# Patient Record
Sex: Male | Born: 1980
Health system: Southern US, Community
[De-identification: ages and names within clinical notes are randomized; demographics above are authoritative.]

## PROBLEM LIST (undated history)

## (undated) DIAGNOSIS — F32A Depression, unspecified: Secondary | ICD-10-CM

## (undated) DIAGNOSIS — F419 Anxiety disorder, unspecified: Secondary | ICD-10-CM

## (undated) DIAGNOSIS — F329 Major depressive disorder, single episode, unspecified: Secondary | ICD-10-CM

## (undated) HISTORY — PX: WISDOM TOOTH EXTRACTION: SHX21

## (undated) HISTORY — DX: Major depressive disorder, single episode, unspecified: F32.9

## (undated) HISTORY — DX: Depression, unspecified: F32.A

## (undated) HISTORY — DX: Anxiety disorder, unspecified: F41.9

---

## 2010-02-20 ENCOUNTER — Ambulatory Visit
Admission: RE | Admit: 2010-02-20 | Discharge: 2010-02-20 | Payer: Self-pay | Source: Home / Self Care | Attending: Internal Medicine | Admitting: Internal Medicine

## 2010-02-20 DIAGNOSIS — Z720 Tobacco use: Secondary | ICD-10-CM | POA: Insufficient documentation

## 2010-02-20 DIAGNOSIS — F329 Major depressive disorder, single episode, unspecified: Secondary | ICD-10-CM | POA: Insufficient documentation

## 2010-02-20 DIAGNOSIS — Z789 Other specified health status: Secondary | ICD-10-CM

## 2010-02-20 DIAGNOSIS — J45909 Unspecified asthma, uncomplicated: Secondary | ICD-10-CM | POA: Insufficient documentation

## 2010-02-20 DIAGNOSIS — F418 Other specified anxiety disorders: Secondary | ICD-10-CM | POA: Insufficient documentation

## 2010-02-24 ENCOUNTER — Telehealth: Payer: Self-pay | Admitting: Internal Medicine

## 2010-03-06 NOTE — Progress Notes (Signed)
Summary: Refill Request  Phone Note Refill Request Call back at [phone] Message from:  Pharmacy on February 24, 2010 8:48 AM  Refills Requested: Medication #1:  KLONOPIN 1 MG TABS 1 by mouth once daily   Dosage confirmed as above?Dosage Confirmed   Brand Name Necessary? No   Supply Requested: 31   [facility]: [facility] [address]  Next Appointment Scheduled: none Initial call taken by: Harold Barban,  February 24, 2010 8:48 AM  Follow-up for Phone Call        Hopp please advise on number to be dispensed to patient #30 or #90 with no refills  ** Never written by our office** Follow-up by: Shonna Chock CMA,  February 24, 2010 2:03 PM  Additional Follow-up for Phone Call Additional follow up Details #1::        prescription faxed. Additional Follow-up by: Lucious Groves CMA,  February 24, 2010 4:42 PM    Prescriptions: KLONOPIN 1 MG TABS (CLONAZEPAM) 1 by mouth once daily  #30 x 5   Entered and Authorized by:   Marga Melnick MD   Signed by:   Marga Melnick MD on 02/24/2010   Method used:   Printed then faxed to ...       [facility]  [address]  [phone]* (retail)       [address] [place], [place]  [address]       Ph: [phone] or [phone]       Fax: [phone]   RxID:   [id number]

## 2010-03-06 NOTE — Assessment & Plan Note (Signed)
Summary: new to est//lch   Vital Signs:  Patient profile:   30 year old male Height:      74.25 inches Weight:      248.2 pounds BMI:     31.77 Temp:     97.5 degrees F oral Pulse rate:   76 / minute Resp:     14 per minute BP sitting:   124 / 84  (left arm) Cuff size:   large  Vitals Entered By: Shonna Chock CMA (February 20, 2010 11:29 AM) CC: 1.) New patient established: Discuss/Follow meds  2.)Examine mole on back-increased in size   CC:  1.) New patient established: Discuss/Follow meds  2.)Examine mole on back-increased in size.  History of Present Illness:    Stephen Compton is here to establish as a new patient ; he is asymptomatic. His wife is concerned about his smokeless tobacco use.  Preventive Screening-Counseling & Management  Alcohol-Tobacco     Smoking Status: quit  Caffeine-Diet-Exercise     Does Patient Exercise: yes  Current Medications (verified): 1)  Klonopin 1 Mg Tabs (Clonazepam) .Marland Kitchen.. 1 By Mouth Once Daily 2)  Prozac 30mg  .... 1 By Mouth Once Daily  Allergies (verified): No Known Drug Allergies  Past History:  Past Medical History: Anxiety/ Depression  Asthma, childhood , PMH of , now quiescent  Past Surgical History: Rotator cuff repair & spur spur resection  2006  Family History: Father: living, estranged Mother: living, hypothyroidism Siblings: 1  brother: HTN  Social History: Occupation: Teacher, English as a foreign language Married Former Smoker: quit age 35; Smokeless tobacco > 1 can / day Alcohol use-yes: 1X /week Regular exercise-yes: 3-4 X / week Smoking Status:  quit Does Patient Exercise:  yes  Review of Systems  The patient denies anorexia, fever, weight loss, weight gain, vision loss, decreased hearing, hoarseness, chest pain, syncope, dyspnea on exertion, peripheral edema, prolonged cough, headaches, hemoptysis, abdominal pain, melena, hematochezia, severe indigestion/heartburn, hematuria, suspicious skin lesions, unusual weight change, abnormal  bleeding, enlarged lymph nodes, and angioedema.   Psych:  Complains of easily angered and irritability; denies anxiety, depression, easily tearful, and panic attacks; Fairly stable on meds.  Physical Exam  General:  Well-developed,well-nourished; alert,appropriate and cooperative throughout examination Head:  Normocephalic and atraumatic without obvious abnormalities. No  alopecia . Moustache & beard Eyes:  No corneal or conjunctival inflammation noted. Perrla. Funduscopic exam benign, without hemorrhages, exudates or papilledema.  Ears:  External ear exam shows no significant lesions or deformities.  Otoscopic examination reveals clear canals, tympanic membranes are intact bilaterally without bulging, retraction, inflammation or discharge. Hearing is grossly normal bilaterally. Nose:  External nasal examination shows no deformity or inflammation. Nasal mucosa are pink and moist without lesions or exudates. Septal dislocation Mouth:  Oral mucosa and oropharynx without lesions or exudates.  Teeth in  fair  repair. Tiny L posterior oral mucosal white lesion Neck:  No deformities, masses, or tenderness noted. Lungs:  Normal respiratory effort, chest expands symmetrically. Lungs are clear to auscultation, no crackles or wheezes. Heart:  Normal rate and regular rhythm. S1 and S2 normal without gallop, murmur, click, rub . Abdomen:  Bowel sounds positive,abdomen soft and non-tender without masses, organomegaly or hernias noted. Rectal:  deferred Genitalia:  Testes bilaterally descended without nodularity, tenderness or masses. No scrotal masses or lesions. No penis lesions or urethral discharge. L varicocele.   Prostate:  Deferred Msk:  No deformity or scoliosis noted of thoracic or lumbar spine.   Pulses:  R and L carotid,radial,dorsalis pedis and  posterior tibial pulses are full and equal bilaterally Extremities:  No clubbing, cyanosis, edema, or deformity noted with normal full range of motion of  all joints.   Neurologic:  alert & oriented X3 and DTRs symmetrical and normal.   Skin:  scattered tatooes Cervical Nodes:  No lymphadenopathy noted Axillary Nodes:  No palpable lymphadenopathy Inguinal Nodes:  No significant adenopathy Psych:  memory intact for recent and remote, normally interactive, and good eye contact.     Impression & Recommendations:  Problem # 1:  ROUTINE GENERAL MEDICAL EXAM@HEALTH  CARE FACL (ICD-V70.0)  Problem # 2:  NICOTINE ADDICTION (ICD-305.1) Smokeless tobacco use; [facility] Dopamine  driven  pathophysiology discussed  Problem # 3:  ANXIETY (ICD-300.00) Pathophysiology of Neurotransmitter Deficiency discussed. Therapeutic options  reviewed His updated medication list for this problem includes:    Klonopin 1 Mg Tabs (Clonazepam) .Marland Kitchen... 1 by mouth once daily    Paroxetine Hcl 30 Mg Tabs (Paroxetine hcl)  Complete Medication List: 1)  Klonopin 1 Mg Tabs (Clonazepam) .Marland Kitchen.. 1 by mouth once daily 2)  Paroxetine Hcl 30 Mg Tabs (Paroxetine hcl)  Patient Instructions: 1)   Monthly self exams.See Dentist every 6 months.Consider fasting labs: 2)  BMP ; 3)  Hepatic Panel ; 4)  Lipid Panel ; 5)  TSH ; 6)  CBC w/ Diff . 7)  Stopping tobacco tips: MAKE  A COMMITTMENT.Choose a Quit date. Cut down before the Quit date. decide what you will do as a substitute when you feel the urge to  use smokeless tobacco(artificial substitute,gum,toothpick,exercise).   Orders Added: 1)  New Patient 18-39 years [[id number]]

## 2010-08-11 ENCOUNTER — Other Ambulatory Visit: Payer: Self-pay | Admitting: Internal Medicine

## 2010-08-11 MED ORDER — CLONAZEPAM 1 MG PO TABS: 1.0000 mg | ORAL_TABLET | Freq: Every day | ORAL | Status: DC

## 2010-08-11 NOTE — Telephone Encounter (Signed)
RX sent to pharmacy  

## 2010-09-17 ENCOUNTER — Other Ambulatory Visit: Payer: Self-pay | Admitting: Internal Medicine

## 2010-10-02 ENCOUNTER — Telehealth: Payer: Self-pay | Admitting: Internal Medicine

## 2010-10-02 ENCOUNTER — Encounter: Payer: Self-pay | Admitting: Internal Medicine

## 2010-10-02 NOTE — Telephone Encounter (Signed)
Patient's only visit was 02/13/2010---tried to call--phone not in service---mailed a letter requesting call to [place] to set up appt for followup appointment and labs

## 2010-10-02 NOTE — Telephone Encounter (Signed)
Message copied by Merrie Roof on Thu Oct 02, 2010  5:05 PM ------      Message from: Pecola Lawless      Created: Sun Sep 21, 2010  9:43 AM       Please verify last office visit; & verify F/U. No records in EMR of labs or OV

## 2010-10-03 ENCOUNTER — Other Ambulatory Visit: Payer: Self-pay | Admitting: Internal Medicine

## 2010-10-03 NOTE — Telephone Encounter (Signed)
Left detailed msg on pharmacy voicemail

## 2010-10-03 NOTE — Telephone Encounter (Signed)
Denied; there is no record of any followup for well over a year. Office visit would be necessary before refill.

## 2010-10-03 NOTE — Telephone Encounter (Signed)
Last filled 08-11-10 #30 3 last OV 02-20-10

## 2010-11-25 ENCOUNTER — Encounter: Payer: Self-pay | Admitting: Internal Medicine

## 2010-11-27 ENCOUNTER — Ambulatory Visit (INDEPENDENT_AMBULATORY_CARE_PROVIDER_SITE_OTHER): Payer: 59 | Admitting: Internal Medicine

## 2010-11-27 ENCOUNTER — Encounter: Payer: Self-pay | Admitting: Internal Medicine

## 2010-11-27 DIAGNOSIS — F411 Generalized anxiety disorder: Secondary | ICD-10-CM

## 2010-11-27 DIAGNOSIS — M25819 Other specified joint disorders, unspecified shoulder: Secondary | ICD-10-CM

## 2010-11-27 DIAGNOSIS — M7542 Impingement syndrome of left shoulder: Secondary | ICD-10-CM

## 2010-11-27 MED ORDER — CLONAZEPAM 1 MG PO TABS: 1.0000 mg | ORAL_TABLET | Freq: Every day | ORAL | Status: DC

## 2010-11-27 NOTE — Patient Instructions (Signed)
Consider glucosamine sulfate 1500 mg daily for joint symptoms. Take this daily  for 3 months and then leave it off for 2 months. This will rehydrate the cartilages.  

## 2010-11-27 NOTE — Progress Notes (Signed)
  Subjective:    Patient ID: Stephen Compton, male    DOB: 06-Oct-1980, 30 y.o.   MRN: [id number]  HPI #1Extremity pain Location:L mid clavicle Onset:12 mos ago Trigger/injury:no Pain quality: variable , dull to sharp Pain severity:up to 8 Duration:hours, up to all day, especially post weight gym work Radiation:no Exacerbating factors:any weight Treatment/response:rest; NSAIDS & Tylenol , ? benefit Review of systems: Constitutional: no fever, chills, sweats, change in weight  Musculoskeletal:no  muscle cramps or pain; no shoulder  joint stiffness, redness, or swelling Skin:no rash, color change Neuro: no weakness; numbness and tingling Heme:no lymphadenopathy; abnormal bruising or bleeding   He had rotator cuff surgery on the right.  #2 he has been on Klonopin "forever". He does take this once a day and has been effective in relationship to anxiety/stress. He never takes more than one half a day. This is usually if he is in situations such taking an exam. This was originally prescribed at urgent care. He's had difficulty getting this refilled despite the fact that he has also not been over utilizing it.      Review of Systems he has noted pain in the  left knee particularly with squatting.     Objective:   Physical Exam he is healthy and well muscled and toned; no acute distress  Range of motion of the neck, shoulder and lower extremities is normal.  He has point tenderness over the distal left clavicle. He has pain with elevation of the left upper extremity  He has mild crepitus in the left knee. No effusion is apparent  Strength and tone are normal as are deep tendon reflexes.  Affect is appropriate; he is oriented         Assessment & Plan:  #1 left shoulder impingement syndrome  #2 additional knee pain without significant finding  Plan: See orders and recommendations.

## 2010-11-28 ENCOUNTER — Other Ambulatory Visit: Payer: Self-pay | Admitting: Internal Medicine

## 2010-11-28 ENCOUNTER — Telehealth: Payer: Self-pay | Admitting: Internal Medicine

## 2010-11-28 DIAGNOSIS — F411 Generalized anxiety disorder: Secondary | ICD-10-CM

## 2010-11-28 MED ORDER — CLONAZEPAM 1 MG PO TABS: 1.0000 mg | ORAL_TABLET | Freq: Every day | ORAL | Status: DC | PRN

## 2010-11-28 NOTE — Telephone Encounter (Signed)
Spoke with patient, he takes one every day and some days needs one and one half. Patient requesting RX to read 1 TO 1 and 1/2 tab once daily PRN. OK?

## 2010-11-28 NOTE — Telephone Encounter (Signed)
OK 

## 2010-11-28 NOTE — Telephone Encounter (Signed)
I called the pharmacy and spoke with pharmacist, dispensed #90/1 refill

## 2010-11-28 NOTE — Telephone Encounter (Signed)
Please state one daily as needed

## 2010-11-28 NOTE — Telephone Encounter (Signed)
Patient had appt [phone] to discuss klonopin - rx was sent  1 tablet daily - patient wants instruction to state prn - [facility]

## 2010-11-28 NOTE — Telephone Encounter (Signed)
Patient and pharm informed.

## 2011-03-13 ENCOUNTER — Other Ambulatory Visit: Payer: Self-pay | Admitting: Internal Medicine

## 2011-05-18 ENCOUNTER — Other Ambulatory Visit: Payer: Self-pay | Admitting: Internal Medicine

## 2011-05-18 DIAGNOSIS — F411 Generalized anxiety disorder: Secondary | ICD-10-CM

## 2011-05-18 NOTE — Telephone Encounter (Signed)
Refill Clonazepam 1MG  Tablet Take 1 to 1%1/2 tablets by mouth every day as needed  No qty listed   Last written 10.26.12 qty 90  Last OV 10.25.12 Current visit on 4.18 for Medication f/u

## 2011-05-19 MED ORDER — CLONAZEPAM 1 MG PO TABS: 1.0000 mg | ORAL_TABLET | Freq: Every day | ORAL | Status: DC | PRN

## 2011-05-19 NOTE — Telephone Encounter (Signed)
RX sent

## 2011-05-21 ENCOUNTER — Ambulatory Visit (INDEPENDENT_AMBULATORY_CARE_PROVIDER_SITE_OTHER): Payer: 59 | Admitting: Internal Medicine

## 2011-05-21 ENCOUNTER — Encounter: Payer: Self-pay | Admitting: Internal Medicine

## 2011-05-21 VITALS — BP 118/78 | HR 66 | Temp 98.3°F | Wt 240.0 lb

## 2011-05-21 DIAGNOSIS — M546 Pain in thoracic spine: Secondary | ICD-10-CM

## 2011-05-21 MED ORDER — CYCLOBENZAPRINE HCL 5 MG PO TABS: ORAL_TABLET | ORAL | Status: DC

## 2011-05-21 MED ORDER — TRAMADOL HCL 50 MG PO TABS: 50.0000 mg | ORAL_TABLET | Freq: Four times a day (QID) | ORAL | Status: AC | PRN

## 2011-05-21 NOTE — Progress Notes (Signed)
  Subjective:    Patient ID: Stephen Compton, male    DOB: 08/08/1980, 31 y.o.   MRN: [id number]  HPI  BACK PAIN Location: left upper back   Quality: Sharp Onset: 4 days ago Worse with: movement, pain wakes pt up at night  Better with: Pt does not notice at work because he is so busy Radiation: no Trauma: 4 days ago, pt remembers reaching out to help son (19 months) from slipping in bath tub. Pain started several hrs later Best sitting/standing/leaning forward: pain feels the same regardless of position Red Flags Fecal/urinary incontinence: pt denies Numbness/Weakness: pt denies Fever/chills/sweats: pt denies Unexplained weight loss: pt denies No relief with bedrest: somewhat h/o cancer/immunosuppression: pt denies PMH of osteoporosis or chronic steroid use: pt denies  Review of Systems he denies pleurisy, cough, sputum production or hemoptysis. He also has not had abdominal pain, nausea or vomiting.  Pt also denies rash. Pt denies shingles. Pt never had chicken pox as a child and does not know if he received the vaccine. Pt has mild arthritis in left knee - mobic X 1 month - did not help so pt stopped taking medication. Pt taking joint vitamins from [facility] shop. Pt says pain is worse with running. Has been referred to ortho specialist. Pt has also had right shoulder surgery.     Objective:   Physical Exam He's healthy well-nourished and in no distress  He has no lymphadenopathy about the neck or axilla Chest is clear with no rubs, rhonchi, or increased work of breathing  His is soft S4 with no murmurs or gallops  Abdomen is nontender with no organomegaly or masses  His no edema, clubbing, or cyanosis. Homans sign is negative.  Skin is clear with no rash; there is a single papule in the mid upper spine area without vesicle formation He does have some point tenderness to deep palpation in the left upper chest        Assessment & Plan:  #1 chest wall pain probably from  hyperextension and lifting during the incident noted above.  Plan: See orders and recommendations. He was asked to monitor for any rash in this area and to call immediately

## 2011-05-21 NOTE — Patient Instructions (Signed)
Take the tramadol at bedtime as needed; do not take it within 8-12 hours the sertraline. Report rash @ site of pain

## 2011-08-17 ENCOUNTER — Other Ambulatory Visit: Payer: Self-pay | Admitting: Internal Medicine

## 2011-08-17 DIAGNOSIS — F411 Generalized anxiety disorder: Secondary | ICD-10-CM

## 2011-08-17 MED ORDER — CLONAZEPAM 1 MG PO TABS: 1.0000 mg | ORAL_TABLET | Freq: Every day | ORAL | Status: DC | PRN

## 2011-08-17 NOTE — Telephone Encounter (Signed)
refill ClonazePAM (Tab) KLONOPIN 1 MG Take 1-1.5 tablets (1-1.5 mg total) by mouth daily as needed for anxiety. #90, LAST FILL 5.31.13 Last ov 4.18.13

## 2011-08-17 NOTE — Telephone Encounter (Signed)
RX called in .

## 2011-11-13 ENCOUNTER — Other Ambulatory Visit: Payer: Self-pay | Admitting: Internal Medicine

## 2011-11-13 NOTE — Telephone Encounter (Signed)
Refill done.  

## 2011-12-06 ENCOUNTER — Other Ambulatory Visit: Payer: Self-pay | Admitting: Internal Medicine

## 2012-02-05 ENCOUNTER — Other Ambulatory Visit: Payer: Self-pay | Admitting: Internal Medicine

## 2012-02-05 NOTE — Telephone Encounter (Signed)
Left message on voicemail informing patient to return call when available. Reason for call: inform patient Controlled Substance Contract to be signed @ office and then rx to be given

## 2012-02-05 NOTE — Telephone Encounter (Signed)
RX and controlled substance contract placed at the front

## 2012-02-05 NOTE — Telephone Encounter (Signed)
Pt returned your call. I advised him he will need to come to office and sign contract. Pt understood.

## 2012-03-03 ENCOUNTER — Encounter: Payer: Self-pay | Admitting: Internal Medicine

## 2012-04-28 ENCOUNTER — Encounter: Payer: Self-pay | Admitting: Internal Medicine

## 2012-04-28 ENCOUNTER — Ambulatory Visit (INDEPENDENT_AMBULATORY_CARE_PROVIDER_SITE_OTHER): Payer: BC Managed Care – PPO | Admitting: Internal Medicine

## 2012-04-28 VITALS — BP 130/78 | HR 84 | Temp 98.5°F | Resp 12 | Ht 74.03 in | Wt 250.0 lb

## 2012-04-28 DIAGNOSIS — F418 Other specified anxiety disorders: Secondary | ICD-10-CM

## 2012-04-28 DIAGNOSIS — K1370 Unspecified lesions of oral mucosa: Secondary | ICD-10-CM

## 2012-04-28 DIAGNOSIS — F341 Dysthymic disorder: Secondary | ICD-10-CM

## 2012-04-28 DIAGNOSIS — Z0000 Encounter for general adult medical examination without abnormal findings: Secondary | ICD-10-CM

## 2012-04-28 DIAGNOSIS — Z720 Tobacco use: Secondary | ICD-10-CM

## 2012-04-28 DIAGNOSIS — F17200 Nicotine dependence, unspecified, uncomplicated: Secondary | ICD-10-CM

## 2012-04-28 MED ORDER — DULOXETINE HCL 30 MG PO CPEP: ORAL_CAPSULE | ORAL | Status: DC

## 2012-04-28 MED ORDER — CLONAZEPAM 1 MG PO TABS: ORAL_TABLET | ORAL | Status: DC

## 2012-04-28 NOTE — Patient Instructions (Addendum)
Please  schedule fasting Labs : BMET,Lipids, hepatic panel, CBC & dif, TSH.PLEASE BRING THESE INSTRUCTIONS TO FOLLOW UP  LAB APPOINTMENT.This will guarantee correct labs are drawn, eliminating need for repeat blood sampling ( needle sticks ! ). Diagnoses /Codes: V70.0.  If you activate the  My Chart system; lab & Xray results will be released directly  to you as soon as I review & address these through the computer. If you choose not to sign up for My Chart within 36 hours of labs being drawn; results will be reviewed & interpretation added before being copied & mailed, causing a delay in getting the results to you.If you do not receive that report within 7-10 days ,please call. Additionally you can use this system to gain direct  access to your records  if  out of town or @ an office of a  physician who is not in  the My Chart network.  This improves continuity of care & places you in control of your medical record.  

## 2012-04-28 NOTE — Progress Notes (Signed)
  Subjective:    Patient ID: Stephen Compton, male    DOB: 03/20/1980, 32 y.o.   MRN: [id number]  HPI  He is here for a physical;acute issues includes work stresses.     Review of Systems The present regimen of clonazepam and Paroxetine had been very effective until recent increase in stress. His he has no definite work schedule and hours can be long. His wife has returned  to school, this increases his parenting duties.Exercise has been curtailed.     Objective:   Physical Exam Gen.: Healthy and well-nourished in appearance. Alert, appropriate and cooperative throughout exam. Appears younger than stated age  Head: Normocephalic without obvious abnormalities; no alopecia. Head shaven. Beard & moustache Eyes: No corneal or conjunctival inflammation noted. Pupils equal round reactive to light and accommodation. Fundal exam is benign without hemorrhages, exudate, papilledema. Extraocular motion intact. Vision grossly normal with lenses Ears: External  ear exam reveals no significant lesions or deformities. Canals clear .TMs normal. Hearing is grossly normal bilaterally. Nose: External nasal exam reveals no deformity or inflammation. Nasal mucosa are pink and moist. No lesions or exudates noted.  Mouth: Oral mucosa somewhat furrowed & thickened on left anteriorly.  Teeth in good repair. Neck: No deformities, masses, or tenderness noted. Range of motion &  Thyroid normal. Lungs: Normal respiratory effort; chest expands symmetrically. Lungs are clear to auscultation without rales, wheezes, or increased work of breathing. Heart: Normal rate and rhythm. Normal S1 and S2. No gallop or rub. Intermittent click @ apex.No murmur. Abdomen: Bowel sounds normal; abdomen soft and nontender. No masses, organomegaly or hernias noted. Genitalia: Genitalia normal except for left varices. DRE deferred Musculoskeletal/extremities: No deformity or scoliosis noted of  the thoracic or lumbar spine.  No clubbing,  cyanosis, edema, or significant extremity  deformity noted. Range of motion normal .Tone & strength  Normal. Joints normal . Nail health good. Able to lie down & sit up w/o help. Negative SLR bilaterally Vascular: Carotid, radial artery, dorsalis pedis and  posterior tibial pulses are full and equal. No bruits present. Neurologic: Alert and oriented x3. Deep tendon reflexes symmetrical and normal.        Skin: Intact without suspicious lesions or rashes. Lymph: No cervical, axillary, or inguinal lymphadenopathy present. Psych: Mood and affect are normal. Normally interactive                                                                                       Assessment & Plan:  #1 comprehensive physical exam; no acute findings #2 exogenous stress #3 mucosal changes in context of snuff use  Plan: see Orders  & Recommendations

## 2012-05-02 ENCOUNTER — Telehealth: Payer: Self-pay | Admitting: *Deleted

## 2012-05-02 NOTE — Telephone Encounter (Signed)
Spoke with pt after he left message on triage stating that after he started taking the cymbalta, he became very anxious and had difficulty sleeping. He just increased the dose to 2 tabs today as directed, wants to know if he needs to continue this medication or do you want to change to a different one.

## 2012-05-02 NOTE — Telephone Encounter (Signed)
Stay on one pill / day & monitor symptoms. Increase to 2 daily if symptoms resolve .Usually such do disappear after 48 hrs

## 2012-05-02 NOTE — Telephone Encounter (Signed)
Spoke with pt advised of Dr. Frederik Pear recommendations. Pt verbalized understanding of plan.

## 2012-05-17 ENCOUNTER — Other Ambulatory Visit: Payer: Self-pay | Admitting: Internal Medicine

## 2012-06-16 ENCOUNTER — Other Ambulatory Visit: Payer: Self-pay | Admitting: Internal Medicine

## 2012-07-28 ENCOUNTER — Encounter: Payer: Self-pay | Admitting: Internal Medicine

## 2012-07-28 ENCOUNTER — Ambulatory Visit (INDEPENDENT_AMBULATORY_CARE_PROVIDER_SITE_OTHER): Payer: BC Managed Care – PPO | Admitting: Internal Medicine

## 2012-07-28 VITALS — BP 138/80 | HR 71 | Temp 98.7°F | Wt 244.0 lb

## 2012-07-28 DIAGNOSIS — F418 Other specified anxiety disorders: Secondary | ICD-10-CM

## 2012-07-28 DIAGNOSIS — F341 Dysthymic disorder: Secondary | ICD-10-CM

## 2012-07-28 MED ORDER — BUPROPION HCL ER (SR) 150 MG PO TB12: ORAL_TABLET | ORAL | Status: DC

## 2012-07-28 NOTE — Progress Notes (Signed)
Subjective:     Patient ID: Stephen Compton, male   DOB: 1980/11/02, 32 y.o.   MRN: [id number]  HPI  He does not feel the Cymbalta 60 mg daily has been of any significant benefit for depression. The major issues are mood swings, mainly anger.  He had switched to the E cigarettes but now has gone back to using the smokeless tobacco. He binge drinks at least 10 beers per week.  He admits to some suicidal ideation; he has no plan for acting out.  The have family history a strongly positive for alcohol abuse and depression, especially in the paternal family.   Review of Systems   He denies chest pain, palpitations, flushing, significant headaches, or diarrhea.     Objective:   Physical Exam  He's healthy and well-nourished and no distress  Extraocular motions intact; no lid lag or proptosis present  Is oriented times three, open and communicative.  Thyroid is normal to palpation. No nodules or enlargement present  Regular rhythm with no murmur.  Chest is clear with no increase worker breathing.  Reflexes are equal but one half plus at the knees.no tremor present  Mood disorder questionnaire completed with four positive answers noted as a serious problem     Assessment:     #1 significant depression with suboptimal response to Cymbalta.  # 2extremely strong family history for depression     Plan:     Options discussed; pathophysiology of neurotransmitter deficiency discussed

## 2012-07-28 NOTE — Patient Instructions (Addendum)
Tritrate off Cymbalta as discussed then start generic Wellbutrin XR 150.

## 2012-07-28 NOTE — Progress Notes (Signed)
  Subjective:    Patient ID: Stephen Compton, male    DOB: 04-22-1980, 32 y.o.   MRN: [id number]  HPI  Cymbalta 60 mg daily has not been effective in treating his depression. His wife describes significant mood swings mainly involving anger issues.  He describes some suicidal ideation without a definitive plan.  He had tried the E cigarettes in attempt to stop using smokeless tobacco but is again using approximately one can daily. He binge drinks 10-14 beers per week according to his wife  His family history was reviewed. There's a strong family history in the paternal family including his father, sister, maternal aunts, paternal uncle, and paternal grandparents. There is also strong history in most of the same family members of alcohol abuse      Review of Systems  He denies significant headache, chest pain, palpitations, flushing, diarrhea, tremor, or significant change in weight.     Objective:   Physical Exam Gen.: Healthy and well-nourished in appearance. Alert, appropriate and cooperative throughout exam. Eyes: No corneal or conjunctival inflammation noted. No lid lag or proptosis. Extraocular motion intact.  Neck: No deformities, masses, or tenderness noted.  Thyroid normal. Lungs: Normal respiratory effort; chest expands symmetrically. Lungs are clear to auscultation without rales, wheezes, or increased work of breathing. Heart: Normal rate and rhythm. Normal S1 and S2. No gallop, click, or rub. No murmur. Abdomen: Bowel sounds normal; abdomen soft and nontender. No masses, organomegaly or hernias noted.                             Musculoskeletal/extremities: No clubbing, cyanosis, edema, or significant extremity  deformity noted. Tone & strength  Normal. Joints normal . Nail health good; no onycholysis. Vascular: Carotid, radial artery, dorsalis pedis and  posterior tibial pulses are full and equal. No bruits present. Neurologic: Alert and oriented x3. Deep tendon reflexes  symmetrical but 1/2+ @ knees. No tremor      Skin: Intact without suspicious lesions or rashes. Lymph: No cervical, axillary lymphadenopathy present. Psych: Mood and affect are normal. Normally interactive. He is open and communicative.  Mood disorder questionnaire: Four positive answers described as a serious problem. Family history of bipolar disorder /manic depressive disorder denied                                                                                     Assessment & Plan:  #1 depression with associated mood swings, mainly anger  Plan: The pathophysiology of neurotransmitter deficiency and its genetic nature were discussed in detail. It was recommended to Cymbalta be weaned and discontinued. Generic Wellbutrin will be titrated to 300 mg daily. He can continue the as needed clonazepam. It was recommended he see a psychologist for anger management issues. He & his wife were anxious to pursue these options.

## 2012-08-18 ENCOUNTER — Ambulatory Visit (INDEPENDENT_AMBULATORY_CARE_PROVIDER_SITE_OTHER): Payer: BC Managed Care – PPO | Admitting: Licensed Clinical Social Worker

## 2012-08-18 DIAGNOSIS — F331 Major depressive disorder, recurrent, moderate: Secondary | ICD-10-CM

## 2012-08-25 ENCOUNTER — Ambulatory Visit (INDEPENDENT_AMBULATORY_CARE_PROVIDER_SITE_OTHER): Payer: BC Managed Care – PPO | Admitting: Licensed Clinical Social Worker

## 2012-08-25 DIAGNOSIS — F331 Major depressive disorder, recurrent, moderate: Secondary | ICD-10-CM

## 2012-08-29 ENCOUNTER — Telehealth: Payer: Self-pay | Admitting: Internal Medicine

## 2012-08-29 ENCOUNTER — Other Ambulatory Visit: Payer: Self-pay | Admitting: Internal Medicine

## 2012-08-29 DIAGNOSIS — F418 Other specified anxiety disorders: Secondary | ICD-10-CM

## 2012-08-29 MED ORDER — CLONAZEPAM 1 MG PO TABS: ORAL_TABLET | ORAL | Status: DC

## 2012-08-29 MED ORDER — BUPROPION HCL ER (SR) 150 MG PO TB12: ORAL_TABLET | ORAL | Status: DC

## 2012-08-29 NOTE — Telephone Encounter (Signed)
Wife/Kendall [phone] called to ask next step.  Nearly out of Clonazepam and Bupropion XL last filled 07/29/12.  Has seen counselor who comes to the office twice.  Asking for refill of medications and recommendation or next step. Was he supposed to come back for another appointment?  Reports no significant improvement on medications;aware it might take > 4 weeks to have effect.  Please call back to advise if Rx will be renewed or if needs to be seen again.

## 2012-08-29 NOTE — Telephone Encounter (Signed)
   As there has not been significant improvement with these 2 medications and consultation with a psychologist; I recommend referral to a psychiatrist to determine medical optimal therapy. This specialist would serve as a Firefighter". Please review the mental health benefits available to you; this will be  on the back of your insurance card as a 1- [phone]-number.

## 2012-08-29 NOTE — Telephone Encounter (Signed)
Spoke with patient's wife, [facility], she will also follow Dr.Hopper's instructions to confirm mental health benefits via insurance company. Kendall requested refill on both meds to hold patient over until he is able to be seen by psych

## 2012-08-29 NOTE — Telephone Encounter (Signed)
Hopp please provide your expert advice

## 2012-08-30 ENCOUNTER — Ambulatory Visit: Payer: BC Managed Care – PPO

## 2012-08-31 ENCOUNTER — Telehealth: Payer: Self-pay

## 2012-08-31 DIAGNOSIS — F419 Anxiety disorder, unspecified: Secondary | ICD-10-CM

## 2012-08-31 NOTE — Telephone Encounter (Signed)
Noted, future orders placed for when patient calls back to schedule

## 2012-08-31 NOTE — Telephone Encounter (Signed)
Left message on VM for patient to return call when available. Reason for call: inform patient of recommended labs and schedule appointment

## 2012-08-31 NOTE — Telephone Encounter (Signed)
Message copied by Maurice Small on Wed Aug 31, 2012  8:57 AM ------      Message from: Pecola Lawless      Created: Tue Aug 30, 2012 12:43 PM                   I would recommend 24-hour urine collection for catecholamines and metanephrines which are stress hormones as well as full thyroid function test (free T3, free T4, and TSH) to verify there is not an endocrinologic etiology for his symptoms. Code: anxiety ------

## 2012-08-31 NOTE — Telephone Encounter (Signed)
Spoke with patient and advised of recommendations. He will call back when he checks his schedule.

## 2012-09-01 ENCOUNTER — Telehealth: Payer: Self-pay

## 2012-09-01 ENCOUNTER — Other Ambulatory Visit (INDEPENDENT_AMBULATORY_CARE_PROVIDER_SITE_OTHER): Payer: BC Managed Care – PPO

## 2012-09-01 DIAGNOSIS — F419 Anxiety disorder, unspecified: Secondary | ICD-10-CM

## 2012-09-01 DIAGNOSIS — F411 Generalized anxiety disorder: Secondary | ICD-10-CM

## 2012-09-01 LAB — T3, FREE: T3, Free: 2.7 pg/mL (ref 2.3–4.2)

## 2012-09-01 LAB — T4, FREE: Free T4: 1.09 ng/dL (ref 0.60–1.60)

## 2012-09-01 LAB — TSH: TSH: 1.44 u[IU]/mL (ref 0.35–5.50)

## 2012-09-01 NOTE — Progress Notes (Unsigned)
Pt requested to have his testosterone  Checked .  [name].

## 2012-09-01 NOTE — Telephone Encounter (Signed)
LVM for patient to CB. See below.

## 2012-09-01 NOTE — Telephone Encounter (Signed)
Message copied by Wende Mott on Thu Sep 01, 2012  3:26 PM ------      Message from: Pecola Lawless      Created: Thu Sep 01, 2012  1:42 PM       Lipids must be done fasting ; insurance will not pay for testosterone in 32 year old w/o clinical documentation in EMR as to indication      ----- Message -----         From: Marshell Garfinkel         Sent: 09/01/2012  12:53 PM           To: Pecola Lawless, MD            Pt coming in this afternoon. Would also like testosterone level and cholesterol checked.      ----- Message -----         From: Pecola Lawless, MD         Sent: 08/30/2012  12:43 PM           To: Maurice Small, CMA, Marshell Garfinkel                        I would recommend 24-hour urine collection for catecholamines and metanephrines which are stress hormones as well as full thyroid function test (free T3, free T4, and TSH) to verify there is not an endocrinologic etiology for his symptoms. Code: anxiety             ------

## 2012-09-02 ENCOUNTER — Telehealth: Payer: Self-pay

## 2012-09-02 LAB — TESTOSTERONE, FREE, TOTAL, SHBG
Testosterone, Free: 59.4 pg/mL (ref 47.0–244.0)
Testosterone-% Free: 2.5 % (ref 1.6–2.9)

## 2012-09-02 NOTE — Telephone Encounter (Signed)
Message copied by Wende Mott on Fri Sep 02, 2012  4:07 PM ------      Message from: Pecola Lawless      Created: Thu Sep 01, 2012  1:42 PM       Lipids must be done fasting ; insurance will not pay for testosterone in 32 year old w/o clinical documentation in EMR as to indication      ----- Message -----         From: Marshell Garfinkel         Sent: 09/01/2012  12:53 PM           To: Pecola Lawless, MD            Pt coming in this afternoon. Would also like testosterone level and cholesterol checked.      ----- Message -----         From: Pecola Lawless, MD         Sent: 08/30/2012  12:43 PM           To: Maurice Small, CMA, Marshell Garfinkel                        I would recommend 24-hour urine collection for catecholamines and metanephrines which are stress hormones as well as full thyroid function test (free T3, free T4, and TSH) to verify there is not an endocrinologic etiology for his symptoms. Code: anxiety             ------

## 2012-09-02 NOTE — Telephone Encounter (Signed)
LVM for patient on 09/01/2012 of information below.

## 2012-09-06 NOTE — Telephone Encounter (Signed)
Patient proceeded with labs.

## 2012-09-08 ENCOUNTER — Ambulatory Visit (INDEPENDENT_AMBULATORY_CARE_PROVIDER_SITE_OTHER): Payer: BC Managed Care – PPO | Admitting: Licensed Clinical Social Worker

## 2012-09-08 ENCOUNTER — Other Ambulatory Visit: Payer: BC Managed Care – PPO

## 2012-09-08 DIAGNOSIS — F331 Major depressive disorder, recurrent, moderate: Secondary | ICD-10-CM

## 2012-09-14 LAB — CATECHOLAMINES, FRACTIONATED, URINE, 24 HOUR
Calculated Total (E+NE): 42 mcg/24 h (ref 26–121)
Creatinine, Urine mg/day-CATEUR: 2.35 g/(24.h) (ref 0.63–2.50)
Dopamine, 24 hr Urine: 210 mcg/24 h (ref 52–480)
Epinephrine, 24 hr Urine: 7 mcg/24 h (ref 2–24)
Norepinephrine, 24 hr Ur: 35 mcg/24 h (ref 15–100)
Total Volume - CF 24Hr U: 3000 mL

## 2012-09-15 ENCOUNTER — Encounter: Payer: Self-pay | Admitting: Internal Medicine

## 2012-09-15 ENCOUNTER — Ambulatory Visit (INDEPENDENT_AMBULATORY_CARE_PROVIDER_SITE_OTHER): Payer: BC Managed Care – PPO | Admitting: Internal Medicine

## 2012-09-15 VITALS — BP 132/88 | HR 73 | Wt 244.0 lb

## 2012-09-15 DIAGNOSIS — R6882 Decreased libido: Secondary | ICD-10-CM

## 2012-09-15 DIAGNOSIS — F319 Bipolar disorder, unspecified: Secondary | ICD-10-CM

## 2012-09-15 NOTE — Assessment & Plan Note (Signed)
The free testosterone is low normal. His exam does not suggest testosterone deficiency.  Fibrocystic changes are present regimen rather than Gynecomastia. This may be aggravated by his caffeine intake.  I discussed the potential long-term risks of testosterone replacement. I recommend an Endocrinology referral to verify that there is truly a deficit.  His anabolic steroid use is remote; his present concerns in possibly to resolve over time off such agents

## 2012-09-15 NOTE — Patient Instructions (Addendum)
Share results with all non [facility] medical staff seen  Fibrocystic breast changes can be aggravated by caffeine intake (coffee, tea, cola, or chocolate). Vitamin E 400 international units daily may help related symptoms.

## 2012-09-15 NOTE — Progress Notes (Signed)
  Subjective:    Patient ID: Stephen Compton, male    DOB: Nov 27, 1980, 32 y.o.   MRN: [id number]  HPI  He has been evaluated by Dr Andee Poles, Psychiatry and placed on the Lamictal with an apparent diagnosis of "mild bipolar disorder". He has had a dramatic improvement in symptoms off Wellbutrin and on this agent.  Although there is no family history of bipolar disorder; there is a strong paternal family history of depression and alcohol abuse which is highly suggestive that this genetic neuro chemical imbalance is hereditary.  He does drink 2 and half cups of coffee a day and one soda. He  Occasionally will have an extra colon as well      Review of Systems   He describes decreased libido; there is no definite dramatic muscle weakness. Remotely he did use anabolic steroids;last 2008. This is not an issue at this time. He does not describe erectile dysfunction.  He does describe persistent fatigue. Thyroid functions were normal  Because of the mood swings; pheochromocytoma screen was performed. The studies were normal.     Objective:   Physical Exam  Well-nourished and physically fit   no lymphadenopathy about the neck or axilla  History of the lateral pectoralis muscles bilaterally.  Fibrocystic breast changes  No organomegaly or  intra-abdominal masses.  Normal testicles; granuloma right epididymal area. Small left variceal  Hair growth appears normal.       Assessment & Plan:  See Current Assessment & Plan in Problem List under specific Diagnosis

## 2012-09-15 NOTE — Assessment & Plan Note (Signed)
I asked him to share these reports with Dr.Lavaive when sitting

## 2012-09-22 ENCOUNTER — Ambulatory Visit (INDEPENDENT_AMBULATORY_CARE_PROVIDER_SITE_OTHER): Payer: BC Managed Care – PPO | Admitting: Licensed Clinical Social Worker

## 2012-09-22 ENCOUNTER — Ambulatory Visit: Payer: BC Managed Care – PPO | Admitting: Licensed Clinical Social Worker

## 2012-09-22 DIAGNOSIS — F331 Major depressive disorder, recurrent, moderate: Secondary | ICD-10-CM

## 2012-10-06 ENCOUNTER — Ambulatory Visit: Payer: BC Managed Care – PPO | Admitting: Licensed Clinical Social Worker

## 2012-10-14 ENCOUNTER — Encounter: Payer: Self-pay | Admitting: Internal Medicine

## 2012-10-24 ENCOUNTER — Ambulatory Visit (INDEPENDENT_AMBULATORY_CARE_PROVIDER_SITE_OTHER): Payer: BC Managed Care – PPO | Admitting: Internal Medicine

## 2012-10-24 ENCOUNTER — Encounter: Payer: Self-pay | Admitting: Internal Medicine

## 2012-10-24 VITALS — BP 122/64 | HR 72 | Temp 98.9°F | Resp 12 | Ht 74.0 in | Wt 242.0 lb

## 2012-10-24 DIAGNOSIS — R6882 Decreased libido: Secondary | ICD-10-CM

## 2012-10-24 NOTE — Progress Notes (Signed)
Patient ID: Stephen Compton, male   DOB: 1980/03/30, 32 y.o.   MRN: [id number]  HPI: Stephen Compton is a 32 y.o.-year-old man, referred by his PCP, Dr. Marga Melnick, for evaluation and management of low libido.  Patient complained of low libido during his visits with his PCP, last on 08/14//2014. A total testosterone checked at 2 pm on 09/01/2012 was low at 233 (300-890), SHBG was borderline low at 19 (13-71), however, a free T4 was normal at 59.4 (47-244). At the same time, a TSH was 1.44, free T4 1.09, free T3 2.7, all normal. Per PCPs exam, he has fibrocystic changes of breasts but no gynecomastia.  Of note, the patient has a history of mood swings (he had pheochromocytoma workup checked and this was normal), and also anxiety and depression (diagnosed with bipolar disorder). He was on multiple medications for anxiety and depression in the past, including bupropion, citalopram, and most recently, clonazepam and lamotrigine. Lamictal was started 1.5 mo ago, he used Wellbutrin before this. Klonopin was started ~ 7 years ago.   He admits for decreased libido ~2-3 years ago, gradually decreasing.  He has a boy (30 y/o). Does not have difficulty obtaining or maintaining an erection No trauma to testes, testicular irradiation or surgery No h/o cryptorchidism No h/o of mumps orchitis/h/o autoimmune ds. He grew and went through puberty like his peers No shrinking of testes. No very small testes (<5 ml) No incomplete/delayed sexual development     No breast discomfort/gynecomastia    No loss of body hair (axillary/pubic)/decreased need for shaving - every other day No height loss No hot flushes No abnormal sense of smell No FH of hemochromatosis or pituitary tumors No vision problems No worst HA of his life No FH of hypogonadism/infertility  No personal h/o infertility - he is married and has 1 child - 3 y/o. No excessive weight gain or loss.  No chronic diseases. He snores, but had a sleep  study several years ago (he was heavier then), which was reportedly neg. for OSA. No more than 2 drinks a day of alcohol at a time, but binges 6-8 beer every 2 weeks Has chronic low back pain. Not on opiates, does not take steroids, but had steroid inj in right shoulder - rotator cuff sd. In 2005. Last injection was <6 mo ago. AC joint pain L shoulder in last 6 weeks. No herbal medicines. No anabolic steroids use recently, last use ~2004 when lifting weights, no current regular exercise No AI ds in his family. He does have family history of early cardiac disease, on mother's side.  ROS: per HPI+ Constitutional: + weight gain/loss, + fatigue, no subjective hyperthermia/hypothermia Eyes: no blurry vision, no xerophthalmia ENT: no sore throat, no nodules palpated in throat, no dysphagia/odynophagia, no hoarseness Cardiovascular: no CP/SOB/palpitations/leg swelling Respiratory: no cough/SOB Gastrointestinal: no N/V/D/C, + GERD Musculoskeletal: + muscle/+ joint aches - shoulders Skin: no rashes Neurological: no tremors/numbness/tingling/dizziness Psychiatric: + both depression/anxiety  Past Medical History  Diagnosis Date  . Anxiety and depression   . Asthma    Past Surgical History  Procedure Laterality Date  . Rotator cuff repair    . Wisdom tooth extraction     History   Social History  . Marital Status: Married    Spouse Name: N/A    Number of Children: 1   Occupational History  . Clinical cytogeneticist [employer]   Social History Main Topics  . Smoking status: Former Smoker    Quit date: 02/02/2002  .  Smokeless tobacco: Current User     Comment: 1ppd ages [age]; 1 can tobacco every 3 days  . Alcohol Use: 8.4 oz/week    14 Cans of beer per week     Comment:  intermittent binge drinker  . Drug Use: No  . Sexual Activity: Not on file   Current Outpatient Prescriptions on File Prior to Visit  Medication Sig Dispense Refill  . clonazePAM (KLONOPIN) 1 MG tablet TAKE 1 OR 1&1/2  TABLETS BY MOUTH EVERY DAY AS NEEDED FOR ANXIETY  90 tablet  0  . lamoTRIgine (LAMICTAL) 25 MG tablet Take 25 mg by mouth 2 (two) times daily.       No current facility-administered medications on file prior to visit.   Allergies  Allergen Reactions  . Wellbutrin [Bupropion]     Experienced suicidal ideation'; complete resolution off medication   Family History  Problem Relation Age of Onset  . Thyroid disease Mother   . Hypertension Brother   . Breast cancer Maternal Grandmother   . Cancer Maternal Grandfather     ? primary  . Cancer Paternal Grandmother     ? primary  . Alcohol abuse Paternal Grandmother   . Cancer Paternal Grandfather     ? primary  . Alcohol abuse Paternal Grandfather   . Heart disease Neg Hx   . Stroke Neg Hx   . Depression Father   . Alcohol abuse Father   . Depression Sister   . Depression Paternal Aunt     two  . Alcohol abuse Paternal Aunt   . Depression Paternal Uncle   . Bipolar disorder      no FH but most likely present(see above)   PE: BP 122/64  Pulse 72  Temp(Src) 98.9 F (37.2 C) (Oral)  Resp 12  Ht 6\' 2"  (1.88 m)  Wt 242 lb (109.77 kg)  BMI 31.06 kg/m2  SpO2 98% Wt Readings from Last 3 Encounters:  10/24/12 242 lb (109.77 kg)  09/15/12 244 lb (110.678 kg)  07/28/12 244 lb (110.678 kg)   Constitutional: normal built, muscular, in NAD Eyes: PERRLA, EOMI, no exophthalmos ENT: moist mucous membranes, no thyromegaly, no cervical lymphadenopathy Cardiovascular: RRR, No MRG Respiratory: CTA B Gastrointestinal: abdomen soft, NT, ND, BS+ Musculoskeletal: no deformities, strength intact in all 4 Skin: moist, warm, no rashes Neurological: mild tremor with outstretched hands, DTR symmetrically decreased in all 4 Genital exam: normal male escutcheon (shaved), no inguinal LAD, normal phallus, testes ~25 mL, no testicular masses, no penile discharge.   ASSESSMENT: 1. Low libido - low total testosterone, normal free T  PLAN:  1. Low  libido and low total testosterone I had a long discussion with the pt regarding his testosterone results, which we reviewed together. I explained that the total testosterone consists of bioavailable T (free T + weakly bound) and bound testosterone. The total testosterone can be low: - later in the afternoon  - when the testes do not secrete enough testosterone (in this case the free testosterone is low, too)   - when the SHBG is low (in this case the free testosterone is normal) In his case, the lower SHBG is most likely the reason for the pt's repeatedly low total T. The treatment for this is not exogenous testosterone, but improving his sleep, diet and exercise. In his case, his psychotropic medication can also contribute, especially clonazepam. She was on SSRIs in the past, but is now doing better on his current regimen of Lamictal and Klonopin. Per  my review of the literature, Lamictal is less likely to cause low libido, but, pain is very likely to do this. - I explained that, in his case, with a free testosterone in the normal range, testosterone treatment is not indicated, and might even be harmful as per the latest studies that showed increased cardiac risk with testosterone supplementation. I advised him that testosterone is a controlled substance and, per guidelines, is only recommended for pts with clearly low testosterone. The normal range is not spanning different ages, but it is determined in young men, at 8-9 am, fasting. Therefore, a free T level of 59 is not "the testosterone level of an 80-y/o man", but can be perfectly normal in a young man, too. - we also discussed about controlling his diet (advised him to cut down fat and meat and increase the use of fruit and vegetables), improve his sleep, exercise regularly. I am wondering whether decreasing his Klonopin might help, I advised him to discuss this with his psychiatrist. I advised him to stay away from steroids as much as he can as thy  can cause decreased libido and testosterone, too. - would check a testosterone level today, he will get this drawn before 9 AM. He is not completely fasting, he had coffee with cream and Splenda. If the free testosterone is abnormal, I will call the patient back for further testing (pituitary workup, testicular workup, check for hemochromatosis).   Office Visit on 10/24/2012  Component Date Value Range Status  . Testosterone 10/24/2012 354  300 - 890 ng/dL Final   Comment:           T[facility] Stage       Male              Male                                        I              < 30 ng/dL        < 10 ng/dL                                        II             < 150 ng/dL       < 30 ng/dL                                        III            100-320 ng/dL     < 35 ng/dL                                        IV             200-970 ng/dL     16-10 ng/dL                                        V/Adult  300-890 ng/dL     16-10 ng/dL                             . Sex Hormone Binding 10/24/2012 19  13 - 71 nmol/L Final  . Testosterone, Free 10/24/2012 93.6  47.0 - 244.0 pg/mL Final   Comment:                            The concentration of free testosterone is derived from a mathematical                          expression based on constants for the binding of testosterone to sex                          hormone-binding globulin and albumin.  . Testosterone-% Freee. 10/24/2012 2.6  1.6 - 2.9 % Final   Normal testosterone. No further w/u indicated.

## 2012-10-24 NOTE — Patient Instructions (Addendum)
Please stop at the lab, write your name on the list, take a seat in the waiting room, and you will be called in for blood draw. If the free testosterone level is abnormal, we will need to check more labs.  As of now, no testosterone treatment is indicated, unless the free testosterone is abnormal. Klonopin can be a reason for low libido, please discuss with your psychiatrist if decreasing the dose is an option. Try to exercise at least 30 min 5x a week and try to eat a low fat, low refined sugar, diet. Try to avoid steroid injections as much as you can. We will schedule a new appointment if the free testosterone is abnormal.

## 2012-10-25 LAB — TESTOSTERONE, FREE, TOTAL, SHBG
Testosterone, Free: 93.6 pg/mL (ref 47.0–244.0)
Testosterone-% Free: 2.6 % (ref 1.6–2.9)
Testosterone: 354 ng/dL (ref 300–890)

## 2012-10-26 ENCOUNTER — Encounter: Payer: Self-pay | Admitting: Internal Medicine

## 2012-12-08 ENCOUNTER — Other Ambulatory Visit: Payer: Self-pay

## 2013-06-01 ENCOUNTER — Ambulatory Visit (INDEPENDENT_AMBULATORY_CARE_PROVIDER_SITE_OTHER): Payer: BC Managed Care – PPO | Admitting: Licensed Clinical Social Worker

## 2013-06-01 DIAGNOSIS — F331 Major depressive disorder, recurrent, moderate: Secondary | ICD-10-CM

## 2013-06-15 ENCOUNTER — Ambulatory Visit (INDEPENDENT_AMBULATORY_CARE_PROVIDER_SITE_OTHER): Payer: BC Managed Care – PPO | Admitting: Licensed Clinical Social Worker

## 2013-06-15 DIAGNOSIS — F331 Major depressive disorder, recurrent, moderate: Secondary | ICD-10-CM

## 2013-06-22 ENCOUNTER — Ambulatory Visit (INDEPENDENT_AMBULATORY_CARE_PROVIDER_SITE_OTHER): Payer: BC Managed Care – PPO | Admitting: Licensed Clinical Social Worker

## 2013-06-22 DIAGNOSIS — F331 Major depressive disorder, recurrent, moderate: Secondary | ICD-10-CM

## 2013-06-29 ENCOUNTER — Ambulatory Visit (INDEPENDENT_AMBULATORY_CARE_PROVIDER_SITE_OTHER): Payer: BC Managed Care – PPO | Admitting: Licensed Clinical Social Worker

## 2013-06-29 DIAGNOSIS — F331 Major depressive disorder, recurrent, moderate: Secondary | ICD-10-CM

## 2013-07-13 ENCOUNTER — Ambulatory Visit: Payer: BC Managed Care – PPO | Admitting: Licensed Clinical Social Worker

## 2013-07-20 ENCOUNTER — Ambulatory Visit (INDEPENDENT_AMBULATORY_CARE_PROVIDER_SITE_OTHER): Payer: BC Managed Care – PPO | Admitting: Licensed Clinical Social Worker

## 2013-07-20 DIAGNOSIS — F331 Major depressive disorder, recurrent, moderate: Secondary | ICD-10-CM

## 2013-07-27 ENCOUNTER — Ambulatory Visit (INDEPENDENT_AMBULATORY_CARE_PROVIDER_SITE_OTHER): Payer: BC Managed Care – PPO | Admitting: Licensed Clinical Social Worker

## 2013-07-27 DIAGNOSIS — F331 Major depressive disorder, recurrent, moderate: Secondary | ICD-10-CM

## 2013-08-03 ENCOUNTER — Ambulatory Visit: Payer: BC Managed Care – PPO | Admitting: Licensed Clinical Social Worker

## 2014-09-20 ENCOUNTER — Encounter: Payer: Self-pay | Admitting: Family Medicine

## 2014-09-20 ENCOUNTER — Ambulatory Visit (INDEPENDENT_AMBULATORY_CARE_PROVIDER_SITE_OTHER): Payer: BLUE CROSS/BLUE SHIELD | Admitting: Family Medicine

## 2014-09-20 VITALS — BP 125/78 | HR 70 | Temp 98.2°F | Ht 74.0 in | Wt 243.0 lb

## 2014-09-20 DIAGNOSIS — F319 Bipolar disorder, unspecified: Secondary | ICD-10-CM

## 2014-09-20 DIAGNOSIS — R5382 Chronic fatigue, unspecified: Secondary | ICD-10-CM

## 2014-09-20 DIAGNOSIS — Z789 Other specified health status: Secondary | ICD-10-CM

## 2014-09-20 DIAGNOSIS — Z720 Tobacco use: Secondary | ICD-10-CM | POA: Diagnosis not present

## 2014-09-20 DIAGNOSIS — R6882 Decreased libido: Secondary | ICD-10-CM | POA: Diagnosis not present

## 2014-09-20 DIAGNOSIS — Z0000 Encounter for general adult medical examination without abnormal findings: Secondary | ICD-10-CM | POA: Diagnosis not present

## 2014-09-20 DIAGNOSIS — R5383 Other fatigue: Secondary | ICD-10-CM | POA: Insufficient documentation

## 2014-09-20 NOTE — Patient Instructions (Addendum)
Great to meet you!  Come back once a year unless we drum up something to talk about in your labs.   I'm going to mention you to our pharmacist, it would be great if you quit snuff.

## 2014-09-20 NOTE — Assessment & Plan Note (Signed)
Good control with current medications Sees FNP in [place] for psych- Granville Lewis at [facility] MD No recommendations for changes

## 2014-09-20 NOTE — Assessment & Plan Note (Signed)
Discussed with him, he is interested in quitting but has not been able to do so cold [place]. Will discuss with her clinical pharmacist who does smoking cessation counseling

## 2014-09-20 NOTE — Progress Notes (Signed)
Patient ID: Stephen Compton, male   DOB: 10/26/1980, 34 y.o.   MRN: [id number]   HPI  Patient presents today to establish care, his previous PCP is retired   Patient reports that he feels well today. His only complaint is chronic generalized fatigue. Is been going on for a few years.  His bipolar 1 disorder scribed is described as mood disorder with anxiety and depression. He takes several medications prescribed by psychiatrist in [place] and feels that he is doing much better than previous. The complaint is that he has difficulty waking up from Zyprexa he takes before bed. He denies any mood issues today.  He recently began lifting weights again. Is also doing cardio 2-3 times per week including walking or biking.  He does not smoke but does use snuff. He's tried to quit cold turkey several times and has not been able to. He is interested in quitting.  He has moderate alcohol consumption and generally avoids getting drunk. He sexually active and his libido is improved  PMH: Smoking status noted His past surgical, family, and social history were also reviewed and updated in the relevant portions of EMR ROS: Per HPI, otherwise negative  Objective: BP 125/78 mmHg  Pulse 70  Temp(Src) 98.2 F (36.8 C) (Oral)  Ht 6' 2" (1.88 m)  Wt 243 lb (110.224 kg)  BMI 31.19 kg/m2 Gen: NAD, alert, cooperative with exam HEENT: NCAT, EOMI, TMs W no BL, nares clear, oropharynx clear Neck: Supple CV: RRR, good S1/S2, no murmur Resp: CTABL, no wheezes, non-labored Abd: SNTND, BS present, no guarding or organomegaly Ext: No edema, warm Neuro: Alert and oriented, No gross deficits Skin: No rash or lesion Psych: Appropriate mood and affect  Assessment and plan:  Snuff user Discussed with him, he is interested in quitting but has not been able to do so cold turkey. Will discuss with her clinical pharmacist who does smoking cessation counseling   Libido, decreased Improved I suspect this may  been previous medication effect Testosterone today  Bipolar disorder Good control with current medications Sees FNP in [place] for psych- Leslie Oneal [facility] MD No recommendations for changes   Fatigue Reports generalized fatigue Possibly medication effect, however checking testosterone and thyroid today Follow-up as needed    Orders Placed This Encounter  Procedures  . Lipid panel  . Thyroid Panel With TSH  . CMP14+EGFR  . Testosterone,Free and Total  . CBC with Differential/Platelet   These were updated today: (not prescribed by me) Meds ordered this encounter  Medications  . OLANZapine (ZYPREXA) 5 MG tablet    Sig: Take 5 mg by mouth at bedtime.  . PARoxetine (PAXIL) 40 MG tablet    Sig: Take 40 mg by mouth every morning.    Sam Bradshaw, MD [facility] 09/20/2014, 11:46 AM      

## 2014-09-20 NOTE — Assessment & Plan Note (Signed)
Reports generalized fatigue Possibly medication effect, however checking testosterone and thyroid today Follow-up as needed

## 2014-09-20 NOTE — Assessment & Plan Note (Signed)
Improved I suspect this may been previous medication effect Testosterone today

## 2014-09-21 LAB — CMP14+EGFR
ALK PHOS: 49 IU/L (ref 39–117)
ALT: 93 IU/L — AB (ref 0–44)
AST: 62 IU/L — AB (ref 0–40)
Albumin/Globulin Ratio: 2 (ref 1.1–2.5)
Albumin: 4.6 g/dL (ref 3.5–5.5)
BUN/Creatinine Ratio: 16 (ref 8–19)
BUN: 16 mg/dL (ref 6–20)
Bilirubin Total: 0.6 mg/dL (ref 0.0–1.2)
CALCIUM: 9.6 mg/dL (ref 8.7–10.2)
CO2: 25 mmol/L (ref 18–29)
CREATININE: 0.97 mg/dL (ref 0.76–1.27)
Chloride: 99 mmol/L (ref 97–108)
GFR calc Af Amer: 119 mL/min/{1.73_m2} (ref >59)
GFR, EST NON AFRICAN AMERICAN: 103 mL/min/{1.73_m2} (ref >59)
GLUCOSE: 93 mg/dL (ref 65–99)
Globulin, Total: 2.3 g/dL (ref 1.5–4.5)
Potassium: 4.7 mmol/L (ref 3.5–5.2)
Sodium: 140 mmol/L (ref 134–144)
Total Protein: 6.9 g/dL (ref 6.0–8.5)

## 2014-09-21 LAB — CBC WITH DIFFERENTIAL/PLATELET
BASOS ABS: 0 10*3/uL (ref 0.0–0.2)
BASOS: 0 %
EOS (ABSOLUTE): 0.1 10*3/uL (ref 0.0–0.4)
Eos: 1 %
Hematocrit: 46.6 % (ref 37.5–51.0)
Hemoglobin: 15.2 g/dL (ref 12.6–17.7)
IMMATURE GRANULOCYTES: 0 %
Immature Grans (Abs): 0 10*3/uL (ref 0.0–0.1)
Lymphocytes Absolute: 1.8 10*3/uL (ref 0.7–3.1)
Lymphs: 36 %
MCH: 29.5 pg (ref 26.6–33.0)
MCHC: 32.6 g/dL (ref 31.5–35.7)
MCV: 91 fL (ref 79–97)
MONOS ABS: 0.3 10*3/uL (ref 0.1–0.9)
Monocytes: 5 %
NEUTROS PCT: 58 %
Neutrophils Absolute: 2.9 10*3/uL (ref 1.4–7.0)
PLATELETS: 239 10*3/uL (ref 150–379)
RBC: 5.15 x10E6/uL (ref 4.14–5.80)
RDW: 13 % (ref 12.3–15.4)
WBC: 5 10*3/uL (ref 3.4–10.8)

## 2014-09-21 LAB — TESTOSTERONE,FREE AND TOTAL
Testosterone, Free: 9.2 pg/mL (ref 8.7–25.1)
Testosterone: 331 ng/dL — ABNORMAL LOW (ref 348–1197)

## 2014-09-21 LAB — THYROID PANEL WITH TSH
Free Thyroxine Index: 1.9 (ref 1.2–4.9)
T3 UPTAKE RATIO: 32 % (ref 24–39)
T4 TOTAL: 5.8 ug/dL (ref 4.5–12.0)
TSH: 2.08 u[IU]/mL (ref 0.450–4.500)

## 2014-09-21 LAB — LIPID PANEL
CHOLESTEROL TOTAL: 161 mg/dL (ref 100–199)
Chol/HDL Ratio: 3.9 ratio units (ref 0.0–5.0)
HDL: 41 mg/dL (ref >39)
LDL Calculated: 108 mg/dL — ABNORMAL HIGH (ref 0–99)
TRIGLYCERIDES: 58 mg/dL (ref 0–149)
VLDL Cholesterol Cal: 12 mg/dL (ref 5–40)

## 2014-12-17 ENCOUNTER — Other Ambulatory Visit: Payer: Self-pay | Admitting: *Deleted

## 2014-12-17 MED ORDER — PAROXETINE HCL 20 MG PO TABS: 20.0000 mg | ORAL_TABLET | Freq: Every day | ORAL | Status: DC

## 2014-12-17 NOTE — Telephone Encounter (Signed)
Pt usually gets his Paxil from his psych doctor and has been out for 3 days, he has called pharmacy to request a refill and also tried to get in touch with his psych. Pt is wondering if you would do a one time refill of his Paxil.

## 2014-12-17 NOTE — Telephone Encounter (Signed)
Spoke to Dr.Bradshaw and he ok'd refill of paxil. Rx approved and pt aware.

## 2015-01-15 ENCOUNTER — Other Ambulatory Visit: Payer: Self-pay | Admitting: *Deleted

## 2015-01-15 MED ORDER — PAROXETINE HCL 20 MG PO TABS: 20.0000 mg | ORAL_TABLET | Freq: Every day | ORAL | Status: DC

## 2015-02-03 HISTORY — PX: ROTATOR CUFF REPAIR: SHX139

## 2015-06-24 ENCOUNTER — Ambulatory Visit (INDEPENDENT_AMBULATORY_CARE_PROVIDER_SITE_OTHER): Payer: BLUE CROSS/BLUE SHIELD | Admitting: Family Medicine

## 2015-06-24 VITALS — BP 126/68 | HR 65 | Temp 98.9°F | Resp 16 | Ht 72.0 in | Wt 246.6 lb

## 2015-06-24 DIAGNOSIS — L03119 Cellulitis of unspecified part of limb: Secondary | ICD-10-CM

## 2015-06-24 MED ORDER — DOXYCYCLINE HYCLATE 100 MG PO TABS: 100.0000 mg | ORAL_TABLET | Freq: Two times a day (BID) | ORAL | Status: DC

## 2015-06-24 NOTE — Progress Notes (Signed)
Subjective:  By signing my name below, I, Stephen Compton, attest that this documentation has been prepared under the direction and in the presence of Stephen Ray, MD.  Electronically Signed: [name], ED Scribe. 06/24/2015. 5:12 PM.   Patient ID: Stephen Compton, male    DOB: 06/02/1980, 35 y.o.   MRN: [id number]  HPI Chief Complaint  Patient presents with  . right elbow pain    "hot to the touch", swollen, x 2 days    HPI Comments: Stephen Compton is a 35 y.o. male who presents to the [facility] complaining of right elbow pain that began 2 days ago. Pt states elbow has been red, warm and puffy as if there is fluid in elbow. He iced elbow yesterday but no other treatments. Pt denies injury to right elbow. He is currently on light duty at work for recent shoulder surgery. Pt denies fever, chills.    Patient Active Problem List   Diagnosis Date Noted  . Fatigue 09/20/2014  . Bipolar disorder ([place]) 09/15/2012  . Libido, decreased 09/15/2012  . Anxiety associated with depression 02/20/2010  . Snuff user 02/20/2010  . ASTHMA 02/20/2010   Past Medical History  Diagnosis Date  . Anxiety and depression   . Asthma    Past Surgical History  Procedure Laterality Date  . Rotator cuff repair    . Wisdom tooth extraction     Allergies  Allergen Reactions  . Wellbutrin [Bupropion]     Experienced suicidal ideation'; complete resolution off medication  . Latuda [Lurasidone Hcl] Other (See Comments)    Suicidal thoughts   Prior to Admission medications   Medication Sig Start Date End Date Taking? Authorizing Provider  clonazePAM (KLONOPIN) 1 MG tablet TAKE 1 OR 1&1/2 TABLETS BY MOUTH EVERY DAY AS NEEDED FOR ANXIETY 08/29/12  Yes Hendricks Limes, MD  lamoTRIgine (LAMICTAL) 25 MG tablet Take 25 mg by mouth 2 (two) times daily.   Yes Historical Provider, MD  OLANZapine (ZYPREXA) 5 MG tablet Take 5 mg by mouth at bedtime.   Yes Historical Provider, MD  PARoxetine  (PAXIL) 20 MG tablet Take 1 tablet (20 mg total) by mouth daily. 01/15/15  Yes Timmothy Euler, MD   Social History   Social History  . Marital Status: Legally Separated    Spouse Name: N/A  . Number of Children: N/A  . Years of Education: N/A   Occupational History  . Not on file.   Social History Main Topics  . Smoking status: Former Smoker    Quit date: 02/02/2002  . Smokeless tobacco: Current User     Comment: 1ppd ages [age]; 1 can tobacco every 3 days  . Alcohol Use: 0.6 oz/week    1 Cans of beer per week     Comment:  intermittent binge drinker  . Drug Use: No  . Sexual Activity: Not on file   Other Topics Concern  . Not on file   Social History Narrative   Review of Systems  Constitutional: Negative for fever, chills, diaphoresis and fatigue.  Skin: Positive for color change. Negative for rash and wound.       Objective:   Physical Exam  Constitutional: He appears well-developed and well-nourished. No distress.  Pulmonary/Chest: Effort normal.  Musculoskeletal:   Right elbow- Full ROM. Induration over olecranon surface with 1 Compton crusted wound. Erythema surrounding elbow that extends approximately 18 x 18cm.   Skin: Skin is warm and dry.  Psychiatric: He  has a normal mood and affect.    Filed Vitals:   06/24/15 1700  BP: 126/68  Pulse: 65  Temp: 98.9 F (37.2 C)  TempSrc: Oral  Resp: 16  Height: 6' (1.829 m)  Weight: 246 lb 9.6 oz (111.857 kg)  SpO2: 98%    Assessment & Plan:   Stephen Compton is a 35 y.o. male Cellulitis of elbow - Plan: doxycycline (VIBRA-TABS) 100 MG tablet, Wound culture  - early, possible source of Compton scab on end of elbow. Does not appear to involve joint, or bursa.   -wound cx, start doxycycline and recheck in 2 days.  Outlined with marker, rtc precautions.   Meds ordered this encounter  Medications  . doxycycline (VIBRA-TABS) 100 MG tablet    Sig: Take 1 tablet (100 mg total) by mouth 2 (two) times daily.     Dispense:  20 tablet    Refill:  0   Patient Instructions       IF you received an x-Compton today, you will receive an invoice from [facility]. Please contact [facility] at [phone] with questions or concerns regarding your invoice.   IF you received labwork today, you will receive an invoice from [employer]. Please contact [facility] at [phone] with questions or concerns regarding your invoice.   Our billing staff will not be able to assist you with questions regarding bills from these companies.  You will be contacted with the lab results as soon as they are available. The fastest way to get your results is to activate your My Chart account. Instructions are located on the last page of this paperwork. If you have not heard from [place] regarding the results in 2 weeks, please contact this office.    Start doxycycline, and recheck in 2 days. If redness is significantly increasing from outlined area tomorrow, return sooner for recheck.  Return to the clinic or go to the nearest emergency room if any of your symptoms worsen or new symptoms occur.  Cellulitis Cellulitis is an infection of the skin and the tissue beneath it. The infected area is usually red and tender. Cellulitis occurs most often in the arms and lower legs.  CAUSES  Cellulitis is caused by bacteria that enter the skin through cracks or cuts in the skin. The most common types of bacteria that cause cellulitis are staphylococci and streptococci. SIGNS AND SYMPTOMS   Redness and warmth.  Swelling.  Tenderness or pain.  Fever. DIAGNOSIS  Your health care provider can usually determine what is wrong based on a physical exam. Blood tests may also be done. TREATMENT  Treatment usually involves taking an antibiotic medicine. HOME CARE INSTRUCTIONS   Take your antibiotic medicine as directed by your health care provider. Finish the antibiotic even if you start to feel  better.  Keep the infected arm or leg elevated to reduce swelling.  Apply a warm cloth to the affected area up to 4 times per day to relieve pain.  Take medicines only as directed by your health care provider.  Keep all follow-up visits as directed by your health care provider. SEEK MEDICAL CARE IF:   You notice red streaks coming from the infected area.  Your red area gets larger or turns dark in color.  Your bone or joint underneath the infected area becomes painful after the skin has healed.  Your infection returns in the same area or another area.  You notice a swollen bump in the infected area.  You develop new symptoms.  You have a fever. SEEK IMMEDIATE MEDICAL CARE IF:   You feel very sleepy.  You develop vomiting or diarrhea.  You have a general ill feeling (malaise) with muscle aches and pains.   This information is not intended to replace advice given to you by your health care provider. Make sure you discuss any questions you have with your health care provider.   Document Released: 10/29/2004 Document Revised: 10/10/2014 Document Reviewed: 04/06/2011 Elsevier Interactive Patient Education [employer].     I personally performed the services described in this documentation, which was scribed in my presence. The recorded information has been reviewed and considered, and addended by me as needed.

## 2015-06-24 NOTE — Patient Instructions (Addendum)
     IF you received an x-ray today, you will receive an invoice from [facility]. Please contact [facility] at [phone] with questions or concerns regarding your invoice.   IF you received labwork today, you will receive an invoice from [employer]. Please contact [facility] at [phone] with questions or concerns regarding your invoice.   Our billing staff will not be able to assist you with questions regarding bills from these companies.  You will be contacted with the lab results as soon as they are available. The fastest way to get your results is to activate your My Chart account. Instructions are located on the last page of this paperwork. If you have not heard from [place] regarding the results in 2 weeks, please contact this office.    Start doxycycline, and recheck in 2 days. If redness is significantly increasing from outlined area tomorrow, return sooner for recheck.  Return to the clinic or go to the nearest emergency room if any of your symptoms worsen or new symptoms occur.  Cellulitis Cellulitis is an infection of the skin and the tissue beneath it. The infected area is usually red and tender. Cellulitis occurs most often in the arms and lower legs.  CAUSES  Cellulitis is caused by bacteria that enter the skin through cracks or cuts in the skin. The most common types of bacteria that cause cellulitis are staphylococci and streptococci. SIGNS AND SYMPTOMS   Redness and warmth.  Swelling.  Tenderness or pain.  Fever. DIAGNOSIS  Your health care provider can usually determine what is wrong based on a physical exam. Blood tests may also be done. TREATMENT  Treatment usually involves taking an antibiotic medicine. HOME CARE INSTRUCTIONS   Take your antibiotic medicine as directed by your health care provider. Finish the antibiotic even if you start to feel better.  Keep the infected arm or leg elevated to reduce  swelling.  Apply a warm cloth to the affected area up to 4 times per day to relieve pain.  Take medicines only as directed by your health care provider.  Keep all follow-up visits as directed by your health care provider. SEEK MEDICAL CARE IF:   You notice red streaks coming from the infected area.  Your red area gets larger or turns dark in color.  Your bone or joint underneath the infected area becomes painful after the skin has healed.  Your infection returns in the same area or another area.  You notice a swollen bump in the infected area.  You develop new symptoms.  You have a fever. SEEK IMMEDIATE MEDICAL CARE IF:   You feel very sleepy.  You develop vomiting or diarrhea.  You have a general ill feeling (malaise) with muscle aches and pains.   This information is not intended to replace advice given to you by your health care provider. Make sure you discuss any questions you have with your health care provider.   Document Released: 10/29/2004 Document Revised: 10/10/2014 Document Reviewed: 04/06/2011 Elsevier Interactive Patient Education [employer].

## 2015-06-26 ENCOUNTER — Encounter: Payer: Self-pay | Admitting: Family Medicine

## 2015-06-26 ENCOUNTER — Ambulatory Visit (INDEPENDENT_AMBULATORY_CARE_PROVIDER_SITE_OTHER): Payer: BLUE CROSS/BLUE SHIELD | Admitting: Family Medicine

## 2015-06-26 VITALS — BP 124/70 | HR 71 | Temp 98.3°F | Resp 16 | Ht 74.0 in | Wt 242.2 lb

## 2015-06-26 DIAGNOSIS — L03119 Cellulitis of unspecified part of limb: Secondary | ICD-10-CM | POA: Diagnosis not present

## 2015-06-26 NOTE — Patient Instructions (Addendum)
     IF you received an x-ray today, you will receive an invoice from [facility]. Please contact [facility] at [phone] with questions or concerns regarding your invoice.   IF you received labwork today, you will receive an invoice from [employer]. Please contact [facility] at [phone] with questions or concerns regarding your invoice.   Our billing staff will not be able to assist you with questions regarding bills from these companies.  You will be contacted with the lab results as soon as they are available. The fastest way to get your results is to activate your My Chart account. Instructions are located on the last page of this paperwork. If you have not heard from [place] regarding the results in 2 weeks, please contact this office.    Your elbow looks better. No organisms seen yet on wound culture.  Continue the doxycycline, but if not continuing to improve, or starts to worsen again - return for recheck.   Cellulitis Cellulitis is an infection of the skin and the tissue beneath it. The infected area is usually red and tender. Cellulitis occurs most often in the arms and lower legs.  CAUSES  Cellulitis is caused by bacteria that enter the skin through cracks or cuts in the skin. The most common types of bacteria that cause cellulitis are staphylococci and streptococci. SIGNS AND SYMPTOMS   Redness and warmth.  Swelling.  Tenderness or pain.  Fever. DIAGNOSIS  Your health care provider can usually determine what is wrong based on a physical exam. Blood tests may also be done. TREATMENT  Treatment usually involves taking an antibiotic medicine. HOME CARE INSTRUCTIONS   Take your antibiotic medicine as directed by your health care provider. Finish the antibiotic even if you start to feel better.  Keep the infected arm or leg elevated to reduce swelling.  Apply a warm cloth to the affected area up to 4 times per day to relieve  pain.  Take medicines only as directed by your health care provider.  Keep all follow-up visits as directed by your health care provider. SEEK MEDICAL CARE IF:   You notice red streaks coming from the infected area.  Your red area gets larger or turns dark in color.  Your bone or joint underneath the infected area becomes painful after the skin has healed.  Your infection returns in the same area or another area.  You notice a swollen bump in the infected area.  You develop new symptoms.  You have a fever. SEEK IMMEDIATE MEDICAL CARE IF:   You feel very sleepy.  You develop vomiting or diarrhea.  You have a general ill feeling (malaise) with muscle aches and pains.   This information is not intended to replace advice given to you by your health care provider. Make sure you discuss any questions you have with your health care provider.   Document Released: 10/29/2004 Document Revised: 10/10/2014 Document Reviewed: 04/06/2011 Elsevier Interactive Patient Education [employer].

## 2015-06-26 NOTE — Progress Notes (Addendum)
By signing my name below, I, Mesha Guinyard, attest that this documentation has been prepared under the direction and in the presence of [name], MD.  Electronically Signed: Verlee Monte, Medical Scribe. 06/26/2015. 9:42 AM.  Subjective:    Patient ID: Stephen Compton, male    DOB: 1980/09/22, 35 y.o.   MRN: [id number]  HPI Chief Complaint  Patient presents with  . Follow-up  . right elbow    "still swollen, not as much, a little sore"    HPI Comments: Stephen Compton is a 35 y.o. male who presents to the [facility] for right elbow swelling - he was seen 2 day ago for cellulitis in his right elbow. He was treated with doxycycline and wound culture was obtained - currently no growth. Pt reports elbow is a little better and it's not as red. Pt denies side affect of the doxycycline. Pt denies chills or fever.  Patient Active Problem List   Diagnosis Date Noted  . Fatigue 09/20/2014  . Bipolar disorder ([place]) 09/15/2012  . Libido, decreased 09/15/2012  . Anxiety associated with depression 02/20/2010  . Snuff user 02/20/2010  . ASTHMA 02/20/2010   Past Medical History  Diagnosis Date  . Anxiety and depression   . Asthma    Past Surgical History  Procedure Laterality Date  . Rotator cuff repair    . Wisdom tooth extraction     Allergies  Allergen Reactions  . Wellbutrin [Bupropion]     Experienced suicidal ideation'; complete resolution off medication  . Latuda [Lurasidone Hcl] Other (See Comments)    Suicidal thoughts   Prior to Admission medications   Medication Sig Start Date End Date Taking? Authorizing Provider  clonazePAM (KLONOPIN) 1 MG tablet TAKE 1 OR 1&1/2 TABLETS BY MOUTH EVERY DAY AS NEEDED FOR ANXIETY 08/29/12  Yes Hendricks Limes, MD  doxycycline (VIBRA-TABS) 100 MG tablet Take 1 tablet (100 mg total) by mouth 2 (two) times daily. 06/24/15  Yes Wendie Agreste, MD  lamoTRIgine (LAMICTAL) 25 MG tablet Take 25 mg by mouth 2 (two) times  daily.   Yes Historical Provider, MD  OLANZapine (ZYPREXA) 5 MG tablet Take 5 mg by mouth at bedtime.   Yes Historical Provider, MD  PARoxetine (PAXIL) 20 MG tablet Take 1 tablet (20 mg total) by mouth daily. 01/15/15  Yes Timmothy Euler, MD   Social History   Social History  . Marital Status: Legally Separated    Spouse Name: N/A  . Number of Children: N/A  . Years of Education: N/A   Occupational History  . Not on file.   Social History Main Topics  . Smoking status: Former Smoker    Quit date: 02/02/2002  . Smokeless tobacco: Current User     Comment: 1ppd ages [age]; 1 can tobacco every 3 days  . Alcohol Use: 0.6 oz/week    1 Cans of beer per week     Comment:  intermittent binge drinker  . Drug Use: No  . Sexual Activity: Not on file   Other Topics Concern  . Not on file   Social History Narrative   Review of Systems  Constitutional: Positive for fever. Negative for chills.  Musculoskeletal: Negative for joint swelling and arthralgias.   Objective:  BP 124/70 mmHg  Pulse 71  Temp(Src) 98.3 F (36.8 C) (Oral)  Resp 16  Ht 6\' 2"  (1.88 m)  Wt 242 lb 3.2 oz (109.861 kg)  BMI 31.08 kg/m2  SpO2 98%  Physical Exam  Constitutional: He appears well-developed and well-nourished. No distress.  HENT:  Head: Normocephalic and atraumatic.  Eyes: Conjunctivae are normal.  Neck: Neck supple.  Cardiovascular: Normal rate.   Pulmonary/Chest: Effort normal.  Musculoskeletal:  Right elbow: Right elbow FROM. Skins intact. Minimal warmth over the extensor surface with slight recession of erythema - especially on upper arm. No apparent bursa swelling. Minimal discomfort in his arm during exam  Neurological: He is alert.  Skin: Skin is warm and dry.  Psychiatric: He has a normal mood and affect. His behavior is normal.  Nursing note and vitals reviewed.   Assessment & Plan:   JOSEFINA RAFFETTO is a 35 y.o. male Cellulitis of elbow  - improving. Continue doxycycline  for completion. Follow wound cx and RTC precautions discussed. Avoid overuse of that elbow for now.   No orders of the defined types were placed in this encounter.   Patient Instructions       IF you received an x-ray today, you will receive an invoice from [facility]. Please contact [facility] at [phone] with questions or concerns regarding your invoice.   IF you received labwork today, you will receive an invoice from [employer]. Please contact [facility] at [phone] with questions or concerns regarding your invoice.   Our billing staff will not be able to assist you with questions regarding bills from these companies.  You will be contacted with the lab results as soon as they are available. The fastest way to get your results is to activate your My Chart account. Instructions are located on the last page of this paperwork. If you have not heard from [place] regarding the results in 2 weeks, please contact this office.    Your elbow looks better. No organisms seen yet on wound culture.  Continue the doxycycline, but if not continuing to improve, or starts to worsen again - return for recheck.   Cellulitis Cellulitis is an infection of the skin and the tissue beneath it. The infected area is usually red and tender. Cellulitis occurs most often in the arms and lower legs.  CAUSES  Cellulitis is caused by bacteria that enter the skin through cracks or cuts in the skin. The most common types of bacteria that cause cellulitis are staphylococci and streptococci. SIGNS AND SYMPTOMS   Redness and warmth.  Swelling.  Tenderness or pain.  Fever. DIAGNOSIS  Your health care provider can usually determine what is wrong based on a physical exam. Blood tests may also be done. TREATMENT  Treatment usually involves taking an antibiotic medicine. HOME CARE INSTRUCTIONS   Take your antibiotic medicine as directed by your health care provider.  Finish the antibiotic even if you start to feel better.  Keep the infected arm or leg elevated to reduce swelling.  Apply a warm cloth to the affected area up to 4 times per day to relieve pain.  Take medicines only as directed by your health care provider.  Keep all follow-up visits as directed by your health care provider. SEEK MEDICAL CARE IF:   You notice red streaks coming from the infected area.  Your red area gets larger or turns dark in color.  Your bone or joint underneath the infected area becomes painful after the skin has healed.  Your infection returns in the same area or another area.  You notice a swollen bump in the infected area.  You develop new symptoms.  You have a fever. [place]  IF:   You feel very sleepy.  You develop vomiting or diarrhea.  You have a general ill feeling (malaise) with muscle aches and pains.   This information is not intended to replace advice given to you by your health care provider. Make sure you discuss any questions you have with your health care provider.   Document Released: 10/29/2004 Document Revised: 10/10/2014 Document Reviewed: 04/06/2011 Elsevier Interactive Patient Education [employer].    I personally performed the services described in this documentation, which was scribed in my presence. The recorded information has been reviewed and considered, and addended by me as needed.

## 2015-06-27 LAB — WOUND CULTURE
GRAM STAIN: NONE SEEN
GRAM STAIN: NONE SEEN
ORGANISM ID, BACTERIA: NO GROWTH

## 2016-03-02 ENCOUNTER — Ambulatory Visit (INDEPENDENT_AMBULATORY_CARE_PROVIDER_SITE_OTHER): Payer: Commercial Managed Care - HMO | Admitting: Family Medicine

## 2016-03-02 ENCOUNTER — Encounter: Payer: Self-pay | Admitting: Family Medicine

## 2016-03-02 VITALS — BP 116/70 | HR 79 | Resp 16 | Wt 275.2 lb

## 2016-03-02 DIAGNOSIS — M25511 Pain in right shoulder: Secondary | ICD-10-CM | POA: Diagnosis not present

## 2016-03-02 DIAGNOSIS — M67911 Unspecified disorder of synovium and tendon, right shoulder: Secondary | ICD-10-CM | POA: Diagnosis not present

## 2016-03-02 NOTE — Progress Notes (Signed)
Subjective:  By signing my name below, I, [name], attest that this documentation has been prepared under the direction and in the presence of [name], MD.  Electronically Signed: [name], ED Scribe. 03/02/2016. 10:40 AM.   Patient ID: Stephen Compton, male    DOB: Jun 15, 1980, 36 y.o.   MRN: [id number]  HPI Chief Complaint  Patient presents with  . Shoulder Pain    right x 2 months  hx of surgery    HPI Comments: Stephen Compton is a 36 y.o. male who presents to the Primary Care at [facility] complaining of right shoulder pain onset 2 months. On review of prior records he was seen 05/2014 through [facility] health with left shoulder pain. He had negative left shoulder XR at that time. Pt reports hx of RTC issues and bone spurs. He reports RTC repair of what sounds like a subacromial decompression in 2016 or 2017 by Dr. Tamera Punt at [place].  Pt has tried ibuprofen intermittently for the past 2 weeks and meloxicam  without relief. He's also tried shoulder exercise. He denies weight loss, fever, and NKI.   Patient Active Problem List   Diagnosis Date Noted  . Fatigue 09/20/2014  . Bipolar disorder ([place]) 09/15/2012  . Libido, decreased 09/15/2012  . Anxiety associated with depression 02/20/2010  . Snuff user 02/20/2010  . ASTHMA 02/20/2010   Past Medical History:  Diagnosis Date  . Anxiety and depression   . Asthma    Past Surgical History:  Procedure Laterality Date  . ROTATOR CUFF REPAIR    . WISDOM TOOTH EXTRACTION     Allergies  Allergen Reactions  . Wellbutrin [Bupropion]     Experienced suicidal ideation'; complete resolution off medication  . Latuda [Lurasidone Hcl] Other (See Comments)    Suicidal thoughts   Prior to Admission medications   Medication Sig Start Date End Date Taking? Authorizing Provider  clonazePAM (KLONOPIN) 1 MG tablet TAKE 1 OR 1&1/2 TABLETS BY MOUTH EVERY DAY AS NEEDED FOR ANXIETY 08/29/12   Hendricks Limes, MD  doxycycline  (VIBRA-TABS) 100 MG tablet Take 1 tablet (100 mg total) by mouth 2 (two) times daily. 06/24/15   Wendie Agreste, MD  lamoTRIgine (LAMICTAL) 25 MG tablet Take 25 mg by mouth 2 (two) times daily.    Historical Provider, MD  OLANZapine (ZYPREXA) 5 MG tablet Take 5 mg by mouth at bedtime.    Historical Provider, MD  PARoxetine (PAXIL) 20 MG tablet Take 1 tablet (20 mg total) by mouth daily. 01/15/15   Timmothy Euler, MD   Social History   Social History  . Marital status: Legally Separated    Spouse name: N/A  . Number of children: N/A  . Years of education: N/A   Occupational History  . Not on file.   Social History Main Topics  . Smoking status: Former Smoker    Quit date: 02/02/2002  . Smokeless tobacco: Current User     Comment: 1ppd ages [age]; 1 can tobacco every 3 days  . Alcohol use 0.6 oz/week    1 Cans of beer per week     Comment:  intermittent binge drinker  . Drug use: No  . Sexual activity: Not on file   Other Topics Concern  . Not on file   Social History Narrative  . No narrative on file   Review of Systems  Musculoskeletal: Positive for arthralgias.  Skin: Negative for color change and rash.  Neurological: Negative for weakness  and numbness.     Objective:   Physical Exam  Constitutional: He is oriented to person, place, and time. He appears well-developed and well-nourished.  HENT:  Head: Normocephalic and atraumatic.  Mouth/Throat: Oropharynx is clear and moist.  Eyes: Conjunctivae and EOM are normal. Pupils are equal, round, and reactive to light.  Neck: Normal range of motion.  Cardiovascular: Normal rate, regular rhythm and normal heart sounds.   Pulmonary/Chest: Effort normal and breath sounds normal.  Abdominal: Soft. Bowel sounds are normal.  Musculoskeletal: Normal range of motion.  Skin intact no apparent effusion. [place], AC, clavicle non tender. cspine full ROM does not create shoulder pain. Right shoulder- discomfort without weakness on  empty can testing otherwise full RTC strength. Minimal discomfort with [place], negative Neer. Negative O'Briens,  negative speed.   Neurological: He is alert and oriented to person, place, and time.  Skin: Skin is warm and dry.  Psychiatric: He has a normal mood and affect.  Nursing note and vitals reviewed.  Vitals:   03/02/16 1104  BP: 116/70  Pulse: 79  Resp: 16  SpO2: 99%  Weight: 275 lb 3.2 oz (124.8 kg)    Assessment & Plan:   Stephen Compton is a 36 y.o. male Pain in joint of right shoulder - Plan: AMB referral to orthopedics Tendinopathy of right rotator cuff - Plan: AMB referral to orthopedics  - Suspected rotator cuff tendinosis with possible mild component of impingement. Has been taking meloxicam daily, previously evaluated with orthopedic surgeon Dr. Tamera Punt who operated on his contralateral shoulder. Range of motion and  Ok to continue RTC strengthening as long as that is not increasing pain, and referred to Dr. Tamera Punt for further evaluation. rtc precautions. Imaging deferred as no acute injury and may be imaged at orthopaedics.   No orders of the defined types were placed in this encounter.  Patient Instructions    Okay to continue the meloxicam for now, range of motion and rotator cuff strengthening as tolerated, but avoid overuse or exercises that increase pain. I will refer you to Dr. Tamera Punt for further evaluation and to decide on possible injection versus formal physical therapy. Follow-up with me sooner if worsening, or let me know if you have questions.    IF you received an x-ray today, you will receive an invoice from [facility]. Please contact [facility] at [phone] with questions or concerns regarding your invoice.   IF you received labwork today, you will receive an invoice from [place]. Please contact [facility] at [phone] with questions or concerns regarding your invoice.   Our billing staff will not be able to assist you  with questions regarding bills from these companies.  You will be contacted with the lab results as soon as they are available. The fastest way to get your results is to activate your My Chart account. Instructions are located on the last page of this paperwork. If you have not heard from [place] regarding the results in 2 weeks, please contact this office.      Rotator Cuff Tendinitis Rotator cuff tendinitis is inflammation of the tough, cord-like bands that connect muscle to bone (tendons) in your rotator cuff. Your rotator cuff is the collection of all the muscles and tendons that connect your arm to your shoulder. Your rotator cuff holds the head of your upper arm bone (humerus) in the cup (fossa) of your shoulder blade (scapula). CAUSES Rotator cuff tendinitis is usually caused by overusing the joint involved.  SIGNS AND SYMPTOMS  Deep ache in the shoulder also felt on the outside upper arm over the shoulder muscle.  Point tenderness over the area that is injured.  Pain comes on gradually and becomes worse with lifting the arm to the side (abduction) or turning it inward (internal rotation).  May lead to a chronic tear: When a rotator cuff tendon becomes inflamed, it runs the risk of losing its blood supply, causing some tendon fibers to die. This increases the risk that the tendon can fray and partially or completely tear. DIAGNOSIS Rotator cuff tendinitis is diagnosed by taking a medical history, performing a physical exam, and reviewing results of imaging exams. The medical history is useful to help determine the type of rotator cuff injury. The physical exam will include looking at the injured shoulder, feeling the injured area, and watching you do range-of-motion exercises. X-ray exams are typically done to rule out other causes of shoulder pain, such as fractures. MRI is the imaging exam usually used for significant shoulder injuries. Sometimes a dye study called CT arthrogram is done, but  it is not as widely used as MRI. In some institutions, special ultrasound tests may also be used to aid in the diagnosis. TREATMENT  Less Severe Cases   Use of a sling to rest the shoulder for a short period of time. Prolonged use of the sling can cause stiffness, weakness, and loss of motion of the shoulder joint.  Anti-inflammatory medicines, such as ibuprofen or naproxen sodium, may be prescribed. More Severe Cases   Physical therapy.  Use of steroid injections into the shoulder joint.  Surgery. HOME CARE INSTRUCTIONS   Use a sling or splint until the pain decreases. Prolonged use of the sling can cause stiffness, weakness, and loss of motion of the shoulder joint.  Apply ice to the injured area:  Put ice in a plastic bag.  Place a towel between your skin and the bag.  Leave the ice on for 20 minutes, 2-3 times a day.  Try to avoid use other than gentle range of motion while your shoulder is painful. Use the shoulder and exercise only as directed by your health care provider. Stop exercises or range of motion if pain or discomfort increases, unless directed otherwise by your health care provider.  Only take over-the-counter or prescription medicines for pain, discomfort, or fever as directed by your health care provider.  If you were given a shoulder sling and straps (immobilizer), do not remove it except as directed, or until you see a health care provider for a follow-up exam. If you need to remove it, move your arm as little as possible or as directed.  You may want to sleep on several pillows at night to lessen swelling and pain. SEEK IMMEDIATE MEDICAL CARE IF:   Your shoulder pain increases or new pain develops in your arm, hand, or fingers and is not relieved with medicines.  You have new, unexplained symptoms, especially increased numbness in the hands or loss of strength.  You develop any worsening of the problems that brought you in for care.  Your arm, hand, or  fingers are numb or tingling.  Your arm, hand, or fingers are swollen, painful, or turn white or blue. MAKE SURE YOU:  Understand these instructions.  Will watch your condition.  Will get help right away if you are not doing well or get worse. This information is not intended to replace advice given to you by your health care provider. Make sure you discuss any  questions you have with your health care provider. Document Released: 04/11/2003 Document Revised: 02/09/2014 Document Reviewed: 08/31/2012 Elsevier Interactive Patient Education  2017 [employer].   I personally performed the services described in this documentation, which was scribed in my presence. The recorded information has been reviewed and considered for accuracy and completeness, addended by me as needed, and agree with information above.  Signed,   [name], MD Primary Care at [place].  03/02/16 11:22 AM

## 2016-03-02 NOTE — Patient Instructions (Addendum)
Okay to continue the meloxicam for now, range of motion and rotator cuff strengthening as tolerated, but avoid overuse or exercises that increase pain. I will refer you to Dr. Tamera Punt for further evaluation and to decide on possible injection versus formal physical therapy. Follow-up with me sooner if worsening, or let me know if you have questions.    IF you received an x-ray today, you will receive an invoice from [facility]. Please contact [facility] at [phone] with questions or concerns regarding your invoice.   IF you received labwork today, you will receive an invoice from [place]. Please contact [facility] at [phone] with questions or concerns regarding your invoice.   Our billing staff will not be able to assist you with questions regarding bills from these companies.  You will be contacted with the lab results as soon as they are available. The fastest way to get your results is to activate your My Chart account. Instructions are located on the last page of this paperwork. If you have not heard from [place] regarding the results in 2 weeks, please contact this office.      Rotator Cuff Tendinitis Rotator cuff tendinitis is inflammation of the tough, cord-like bands that connect muscle to bone (tendons) in your rotator cuff. Your rotator cuff is the collection of all the muscles and tendons that connect your arm to your shoulder. Your rotator cuff holds the head of your upper arm bone (humerus) in the cup (fossa) of your shoulder blade (scapula). CAUSES Rotator cuff tendinitis is usually caused by overusing the joint involved.  SIGNS AND SYMPTOMS  Deep ache in the shoulder also felt on the outside upper arm over the shoulder muscle.  Point tenderness over the area that is injured.  Pain comes on gradually and becomes worse with lifting the arm to the side (abduction) or turning it inward (internal rotation).  May lead to a chronic tear: When a rotator cuff  tendon becomes inflamed, it runs the risk of losing its blood supply, causing some tendon fibers to die. This increases the risk that the tendon can fray and partially or completely tear. DIAGNOSIS Rotator cuff tendinitis is diagnosed by taking a medical history, performing a physical exam, and reviewing results of imaging exams. The medical history is useful to help determine the type of rotator cuff injury. The physical exam will include looking at the injured shoulder, feeling the injured area, and watching you do range-of-motion exercises. X-ray exams are typically done to rule out other causes of shoulder pain, such as fractures. MRI is the imaging exam usually used for significant shoulder injuries. Sometimes a dye study called CT arthrogram is done, but it is not as widely used as MRI. In some institutions, special ultrasound tests may also be used to aid in the diagnosis. TREATMENT  Less Severe Cases   Use of a sling to rest the shoulder for a short period of time. Prolonged use of the sling can cause stiffness, weakness, and loss of motion of the shoulder joint.  Anti-inflammatory medicines, such as ibuprofen or naproxen sodium, may be prescribed. More Severe Cases   Physical therapy.  Use of steroid injections into the shoulder joint.  Surgery. HOME CARE INSTRUCTIONS   Use a sling or splint until the pain decreases. Prolonged use of the sling can cause stiffness, weakness, and loss of motion of the shoulder joint.  Apply ice to the injured area:  Put ice in a plastic bag.  Place a towel between your skin  and the bag.  Leave the ice on for 20 minutes, 2-3 times a day.  Try to avoid use other than gentle range of motion while your shoulder is painful. Use the shoulder and exercise only as directed by your health care provider. Stop exercises or range of motion if pain or discomfort increases, unless directed otherwise by your health care provider.  Only take over-the-counter or  prescription medicines for pain, discomfort, or fever as directed by your health care provider.  If you were given a shoulder sling and straps (immobilizer), do not remove it except as directed, or until you see a health care provider for a follow-up exam. If you need to remove it, move your arm as little as possible or as directed.  You may want to sleep on several pillows at night to lessen swelling and pain. SEEK IMMEDIATE MEDICAL CARE IF:   Your shoulder pain increases or new pain develops in your arm, hand, or fingers and is not relieved with medicines.  You have new, unexplained symptoms, especially increased numbness in the hands or loss of strength.  You develop any worsening of the problems that brought you in for care.  Your arm, hand, or fingers are numb or tingling.  Your arm, hand, or fingers are swollen, painful, or turn white or blue. MAKE SURE YOU:  Understand these instructions.  Will watch your condition.  Will get help right away if you are not doing well or get worse. This information is not intended to replace advice given to you by your health care provider. Make sure you discuss any questions you have with your health care provider. Document Released: 04/11/2003 Document Revised: 02/09/2014 Document Reviewed: 08/31/2012 Elsevier Interactive Patient Education  2017 [employer].

## 2016-06-03 DIAGNOSIS — Z5181 Encounter for therapeutic drug level monitoring: Secondary | ICD-10-CM | POA: Diagnosis not present

## 2016-07-11 ENCOUNTER — Encounter: Payer: Self-pay | Admitting: Family Medicine

## 2016-07-11 ENCOUNTER — Ambulatory Visit (INDEPENDENT_AMBULATORY_CARE_PROVIDER_SITE_OTHER): Payer: Commercial Managed Care - HMO

## 2016-07-11 ENCOUNTER — Ambulatory Visit (INDEPENDENT_AMBULATORY_CARE_PROVIDER_SITE_OTHER): Payer: Commercial Managed Care - HMO | Admitting: Family Medicine

## 2016-07-11 VITALS — BP 122/72 | HR 78 | Temp 99.5°F | Resp 16 | Ht 74.25 in | Wt 248.4 lb

## 2016-07-11 DIAGNOSIS — M25512 Pain in left shoulder: Secondary | ICD-10-CM

## 2016-07-11 DIAGNOSIS — S92511A Displaced fracture of proximal phalanx of right lesser toe(s), initial encounter for closed fracture: Secondary | ICD-10-CM

## 2016-07-11 DIAGNOSIS — D229 Melanocytic nevi, unspecified: Secondary | ICD-10-CM

## 2016-07-11 DIAGNOSIS — M79671 Pain in right foot: Secondary | ICD-10-CM | POA: Diagnosis not present

## 2016-07-11 DIAGNOSIS — S92515A Nondisplaced fracture of proximal phalanx of left lesser toe(s), initial encounter for closed fracture: Secondary | ICD-10-CM | POA: Diagnosis not present

## 2016-07-11 MED ORDER — MELOXICAM 7.5 MG PO TABS
7.5000 mg | ORAL_TABLET | Freq: Every day | ORAL | 1 refills | Status: DC
Start: 2016-07-11 — End: 2016-09-09

## 2016-07-11 NOTE — Patient Instructions (Addendum)
For your shoulder, ok to use mobic as needed if it flairs up, but if you are requiring that daily or persistent pains - I would want you to meet with your orthopedist.   You do have a small toe fracture. Those usually heal up with time, but you can buddy tape the fourth to fifth toes for the next few weeks, and wear a hard soled shoe such as a hiking boot or work boot for more comfort. If that is not improving, I can refer you to foot specialist.   I referred you to a dermatologist to look at the mole and possibly have that removed.  Return to the clinic or go to the nearest emergency room if any of your symptoms worsen or new symptoms occur.   Toe Fracture A toe fracture is a break in one of the toe bones (phalanges). What are the causes? This condition may be caused by:  Dropping a heavy object on your toe.  Stubbing your toe.  Overusing your toe or doing repetitive exercise.  Twisting or stretching your toe out of place.  What increases the risk? This condition is more likely to develop in people who:  Play contact sports.  Have a bone disease.  Have a low calcium level.  What are the signs or symptoms? The main symptoms of this condition are swelling and pain in the toe. The pain may get worse with standing or walking. Other symptoms include:  Bruising.  Stiffness.  Numbness.  A change in the way the toe looks.  Broken bones that poke through the skin.  Blood beneath the toenail.  How is this diagnosed? This condition is diagnosed with a physical exam. You may also have X-rays. How is this treated? Treatment for this condition depends on the type of fracture and its severity. Treatment may involve:  Taping the broken toe to a toe that is next to it (buddy taping). This is the most common treatment for fractures in which the bone has not moved out of place (nondisplaced fracture).  Wearing a shoe that has a wide, rigid sole to protect the toe and to limit  its movement.  Wearing a walking cast.  Having a procedure to move the toe back into place.  Surgery. This may be needed: ? If there are many pieces of broken bone that are out of place (displaced). ? If the toe joint breaks. ? If the bone breaks through the skin.  Physical therapy. This is done to help regain movement and strength in the toe.  You may need follow-up X-rays to make sure that the bone is healing well and staying in position. Follow these instructions at home: If you have a cast:  Do not stick anything inside the cast to scratch your skin. Doing that increases your risk of infection.  Check the skin around the cast every day. Report any concerns to your health care provider. You may put lotion on dry skin around the edges of the cast. Do not apply lotion to the skin underneath the cast.  Do not put pressure on any part of the cast until it is fully hardened. This may take several hours.  Keep the cast clean and dry. Bathing  Do not take baths, swim, or use a hot tub until your health care provider approves. Ask your health care provider if you can take showers. You may only be allowed to take sponge baths for bathing.  If your health care provider approves  bathing and showering, cover the cast or bandage (dressing) with a watertight plastic bag to protect it from water. Do not let the cast or dressing get wet. Managing pain, stiffness, and swelling  If you do not have a cast, apply ice to the injured area, if directed. ? Put ice in a plastic bag. ? Place a towel between your skin and the bag. ? Leave the ice on for 20 minutes, 2-3 times per day.  Move your toes often to avoid stiffness and to lessen swelling.  Raise (elevate) the injured area above the level of your heart while you are sitting or lying down. Driving  Do not drive or operate heavy machinery while taking pain medicine.  Do not drive while wearing a cast on a foot that you use for  driving. Activity  Return to your normal activities as directed by your health care provider. Ask your health care provider what activities are safe for you.  Perform exercises daily as directed by your health care provider or physical therapist. Safety  Do not use the injured limb to support your body weight until your health care provider says that you can. Use crutches or other assistive devices as directed by your health care provider. General instructions  If your toe was treated with buddy taping, follow your health care provider's instructions for changing the gauze and tape. Change it more often: ? The gauze and tape get wet. If this happens, dry the space between the toes. ? The gauze and tape are too tight and cause your toe to become pale or numb.  Wear a protective shoe as directed by your health care provider. If you were not given a protective shoe, wear sturdy, supportive shoes. Your shoes should not pinch your toes and should not fit tightly against your toes.  Do not use any tobacco products, including cigarettes, chewing tobacco, or e-cigarettes. Tobacco can delay bone healing. If you need help quitting, ask your health care provider.  Take medicines only as directed by your health care provider.  Keep all follow-up visits as directed by your health care provider. This is important. Contact a health care provider if:  You have a fever.  Your pain medicine is not helping.  Your toe is cold.  Your toe is numb.  You still have pain after one week of rest and treatment.  You still have pain after your health care provider has said that you can start walking again.  You have pain, tingling, or numbness in your foot that is not going away. Get help right away if:  You have severe pain.  You have redness or inflammation in your toe that is getting worse.  You have pain or numbness in your toe that is getting worse.  Your toe turns blue. This information is not  intended to replace advice given to you by your health care provider. Make sure you discuss any questions you have with your health care provider. Document Released: 01/17/2000 Document Revised: 09/23/2015 Document Reviewed: 11/15/2013 Elsevier Interactive Patient Education  2018 [employer].    IF you received an x-ray today, you will receive an invoice from [facility]. Please contact [facility] at [phone] with questions or concerns regarding your invoice.   IF you received labwork today, you will receive an invoice from [place]. Please contact [facility] at [phone] with questions or concerns regarding your invoice.   Our billing staff will not be able to assist you with questions regarding bills from  these companies.  You will be contacted with the lab results as soon as they are available. The fastest way to get your results is to activate your My Chart account. Instructions are located on the last page of this paperwork. If you have not heard from [place] regarding the results in 2 weeks, please contact this office.

## 2016-07-11 NOTE — Progress Notes (Signed)
By signing my name below, I, Mesha Guinyard, attest that this documentation has been prepared under the direction and in the presence of [name], MD.  Electronically Signed: Verlee Monte, Medical Scribe. 07/11/16. 10:51 AM.  Subjective:    Patient ID: Stephen Compton, male    DOB: 03/22/1980, 36 y.o.   MRN: [id number]  HPI Chief Complaint  Patient presents with  . Shoulder Pain    left shoulder   . Foot Pain    right foot, fell down stairs     HPI Comments: Stephen Compton is a 36 y.o. male who presents to Primary Care at [facility] complaining of left shoulder and right foot pain.  Left Shoulder: He was coming to see me with right shoulder pain in Jan. Suspected RTC tendinois and impingement. Discussed ROM and referred to Dr. Tamera Punt. Reports radiating pain to his collar bone. Pt has been taking meloxicam for 2 years for relief of his sxs. Has had surgery on both shoulders in the past. Pt's workers comp was settled by Dr. Tamera Punt - last saw couple of months ago. Continues to have intermittent right shoulder pain. Pt's bp fluctuates, but it's typically controlled. Denies heart issues in the past or family. Denies broken collar bone in past.  Right Foot: constant soreness daily after falling on steps a month ago when trying to chase his 36 y/o. Locates pain on the ball of his foot and outside of small toe area. Initially after injury it turned a little black and blue with pain on ambulation for a week - discoloration has resolved since.   Mole: Located on back and noticed it "come off". Does not have a dermatologist.  Patient Active Problem List   Diagnosis Date Noted  . Fatigue 09/20/2014  . Bipolar disorder ([place]) 09/15/2012  . Libido, decreased 09/15/2012  . Anxiety associated with depression 02/20/2010  . Snuff user 02/20/2010  . ASTHMA 02/20/2010   Past Medical History:  Diagnosis Date  . Anxiety and depression   . Asthma    Past Surgical History:  Procedure  Laterality Date  . ROTATOR CUFF REPAIR Right 2017  . WISDOM TOOTH EXTRACTION     Allergies  Allergen Reactions  . Wellbutrin [Bupropion]     Experienced suicidal ideation'; complete resolution off medication  . Latuda [Lurasidone Hcl] Other (See Comments)    Suicidal thoughts   Prior to Admission medications   Medication Sig Start Date End Date Taking? Authorizing Provider  clonazePAM (KLONOPIN) 1 MG tablet TAKE 1 OR 1&1/2 TABLETS BY MOUTH EVERY DAY AS NEEDED FOR ANXIETY 08/29/12  Yes Hendricks Limes, MD  lamoTRIgine (LAMICTAL) 25 MG tablet Take 25 mg by mouth 2 (two) times daily.   Yes [provider]  meloxicam (MOBIC) 15 MG tablet TAKE 1 TABLET BY MOUTH ONCE DAILY WITH FOOD 05/13/16  Yes [provider]  OLANZapine (ZYPREXA) 5 MG tablet Take 5 mg by mouth at bedtime.   Yes [provider]  PARoxetine (PAXIL) 20 MG tablet Take 1 tablet (20 mg total) by mouth daily. 01/15/15  Yes Timmothy Euler, MD   Social History   Social History  . Marital status: Legally Separated    Spouse name: N/A  . Number of children: N/A  . Years of education: N/A   Occupational History  . Not on file.   Social History Main Topics  . Smoking status: Former Smoker    Quit date: 02/02/2002  . Smokeless tobacco: Current User  Types: Snuff     Comment: 1ppd ages [age]; 1 can tobacco every 3 days  . Alcohol use 0.6 oz/week    1 Cans of beer per week     Comment:  intermittent  drinker  . Drug use: No  . Sexual activity: No   Other Topics Concern  . Not on file   Social History Narrative  . No narrative on file   Review of Systems  Musculoskeletal: Positive for arthralgias.   Objective:  Physical Exam  Constitutional: He appears well-developed and well-nourished. No distress.  HENT:  Head: Normocephalic and atraumatic.  Eyes: Conjunctivae are normal.  Neck: Neck supple.  Pain free FROM  Cardiovascular: Normal rate.   Pulmonary/Chest: Effort normal.    Musculoskeletal:  Shoulder: RTC strength was full, pain free Minimal AC tenderness Negative Neer Negative Hawkins Negative O'Brien Ankle FROM non tender No bony tenderness Right foot: no soft tissue swelling Skin intact No bruising Navicula non tender Proximal 5th MT non tender Slight tenderness along distal 5th metatarsal at 5th MTP slight discomfort of 5th toe  Neurological: He is alert.  Skin: Skin is warm, dry and intact. No bruising noted.  Elevated nevis on mid back approx 6 -7 mm with some darkening centrally but overall appeares to be flesh colored Does protrude on surface  Psychiatric: He has a normal mood and affect. His behavior is normal.  Nursing note and vitals reviewed.   Vitals:   07/11/16 1010  BP: (!) 148/81  Pulse: 78  Resp: 16  Temp: 99.5 F (37.5 C)  TempSrc: Oral  SpO2: 100%  Weight: 248 lb 6 oz (112.7 kg)  Height: 6' 2.25" (1.886 m)  Body mass index is 31.68 kg/m.   Dg Foot Complete Right  Result Date: 07/11/2016 CLINICAL DATA:  Right foot pain following fall 1 month ago. Initial encounter. EXAM: RIGHT FOOT COMPLETE - 3+ VIEW COMPARISON:  None. FINDINGS: There is a fracture at the medial base of the little toe proximal phalanx. There is no evidence of subluxation or dislocation. No other fractures are noted. The Lisfranc joints are unremarkable. IMPRESSION: Fracture at the medial base of the little toe proximal phalanx. Electronically Signed   By: Margarette Canada M.D.   On: 07/11/2016 11:15   Assessment & Plan:    Stephen Compton is a 36 y.o. male Left shoulder pain, unspecified chronicity - Plan: meloxicam (MOBIC) 7.5 MG tablet  - Aerobic refilled for intermittent use, but if persistent need, recommended orthopedic follow-up.  Right foot pain - Plan: DG Foot Complete Right Displaced fracture of proximal phalanx of right lesser toe(s), initial encounter for closed fracture  -toe fracture as above. Buddy taping, hard soled shoe, recheck if not  improving within the next 4 weeks.  Nevus - Plan: Ambulatory referral to Dermatology  -Change in nevus of back. Refer to dermatology for possible excision/biopsy.  Meds ordered this encounter  Medications  . DISCONTD: meloxicam (MOBIC) 15 MG tablet    Sig: TAKE 1 TABLET BY MOUTH ONCE DAILY WITH FOOD    Refill:  0  . meloxicam (MOBIC) 7.5 MG tablet    Sig: Take 1 tablet (7.5 mg total) by mouth daily.    Dispense:  30 tablet    Refill:  1   Patient Instructions     For your shoulder, ok to use mobic as needed if it flairs up, but if you are requiring that daily or persistent pains - I would want you to meet with your  orthopedist.   You do have a small toe fracture. Those usually heal up with time, but you can buddy tape the fourth to fifth toes for the next few weeks, and wear a hard soled shoe such as a hiking boot or work boot for more comfort. If that is not improving, I can refer you to foot specialist.   I referred you to a dermatologist to look at the mole and possibly have that removed.  Return to the clinic or go to the nearest emergency room if any of your symptoms worsen or new symptoms occur.   Toe Fracture A toe fracture is a break in one of the toe bones (phalanges). What are the causes? This condition may be caused by:  Dropping a heavy object on your toe.  Stubbing your toe.  Overusing your toe or doing repetitive exercise.  Twisting or stretching your toe out of place.  What increases the risk? This condition is more likely to develop in people who:  Play contact sports.  Have a bone disease.  Have a low calcium level.  What are the signs or symptoms? The main symptoms of this condition are swelling and pain in the toe. The pain may get worse with standing or walking. Other symptoms include:  Bruising.  Stiffness.  Numbness.  A change in the way the toe looks.  Broken bones that poke through the skin.  Blood beneath the toenail.  How is  this diagnosed? This condition is diagnosed with a physical exam. You may also have X-rays. How is this treated? Treatment for this condition depends on the type of fracture and its severity. Treatment may involve:  Taping the broken toe to a toe that is next to it (buddy taping). This is the most common treatment for fractures in which the bone has not moved out of place (nondisplaced fracture).  Wearing a shoe that has a wide, rigid sole to protect the toe and to limit its movement.  Wearing a walking cast.  Having a procedure to move the toe back into place.  Surgery. This may be needed: ? If there are many pieces of broken bone that are out of place (displaced). ? If the toe joint breaks. ? If the bone breaks through the skin.  Physical therapy. This is done to help regain movement and strength in the toe.  You may need follow-up X-rays to make sure that the bone is healing well and staying in position. Follow these instructions at home: If you have a cast:  Do not stick anything inside the cast to scratch your skin. Doing that increases your risk of infection.  Check the skin around the cast every day. Report any concerns to your health care provider. You may put lotion on dry skin around the edges of the cast. Do not apply lotion to the skin underneath the cast.  Do not put pressure on any part of the cast until it is fully hardened. This may take several hours.  Keep the cast clean and dry. Bathing  Do not take baths, swim, or use a hot tub until your health care provider approves. Ask your health care provider if you can take showers. You may only be allowed to take sponge baths for bathing.  If your health care provider approves bathing and showering, cover the cast or bandage (dressing) with a watertight plastic bag to protect it from water. Do not let the cast or dressing get wet. Managing pain, stiffness, and swelling  If you do not have a cast, apply ice to the  injured area, if directed. ? Put ice in a plastic bag. ? Place a towel between your skin and the bag. ? Leave the ice on for 20 minutes, 2-3 times per day.  Move your toes often to avoid stiffness and to lessen swelling.  Raise (elevate) the injured area above the level of your heart while you are sitting or lying down. Driving  Do not drive or operate heavy machinery while taking pain medicine.  Do not drive while wearing a cast on a foot that you use for driving. Activity  Return to your normal activities as directed by your health care provider. Ask your health care provider what activities are safe for you.  Perform exercises daily as directed by your health care provider or physical therapist. Safety  Do not use the injured limb to support your body weight until your health care provider says that you can. Use crutches or other assistive devices as directed by your health care provider. General instructions  If your toe was treated with buddy taping, follow your health care provider's instructions for changing the gauze and tape. Change it more often: ? The gauze and tape get wet. If this happens, dry the space between the toes. ? The gauze and tape are too tight and cause your toe to become pale or numb.  Wear a protective shoe as directed by your health care provider. If you were not given a protective shoe, wear sturdy, supportive shoes. Your shoes should not pinch your toes and should not fit tightly against your toes.  Do not use any tobacco products, including cigarettes, chewing tobacco, or e-cigarettes. Tobacco can delay bone healing. If you need help quitting, ask your health care provider.  Take medicines only as directed by your health care provider.  Keep all follow-up visits as directed by your health care provider. This is important. Contact a health care provider if:  You have a fever.  Your pain medicine is not helping.  Your toe is cold.  Your toe is  numb.  You still have pain after one week of rest and treatment.  You still have pain after your health care provider has said that you can start walking again.  You have pain, tingling, or numbness in your foot that is not going away. Get help right away if:  You have severe pain.  You have redness or inflammation in your toe that is getting worse.  You have pain or numbness in your toe that is getting worse.  Your toe turns blue. This information is not intended to replace advice given to you by your health care provider. Make sure you discuss any questions you have with your health care provider. Document Released: 01/17/2000 Document Revised: 09/23/2015 Document Reviewed: 11/15/2013 Elsevier Interactive Patient Education  2018 [employer].    IF you received an x-ray today, you will receive an invoice from [facility]. Please contact [facility] at [phone] with questions or concerns regarding your invoice.   IF you received labwork today, you will receive an invoice from [place]. Please contact [facility] at [phone] with questions or concerns regarding your invoice.   Our billing staff will not be able to assist you with questions regarding bills from these companies.  You will be contacted with the lab results as soon as they are available. The fastest way to get your results is to activate your My Chart account. Instructions are located  on the last page of this paperwork. If you have not heard from [place] regarding the results in 2 weeks, please contact this office.       I personally performed the services described in this documentation, which was scribed in my presence. The recorded information has been reviewed and considered for accuracy and completeness, addended by me as needed, and agree with information above.  Signed,   [name], MD Primary [facility] at [place].  07/15/16 4:11 PM

## 2016-09-09 ENCOUNTER — Other Ambulatory Visit: Payer: Self-pay | Admitting: Family Medicine

## 2016-09-09 DIAGNOSIS — M25512 Pain in left shoulder: Secondary | ICD-10-CM

## 2016-11-08 ENCOUNTER — Other Ambulatory Visit: Payer: Self-pay | Admitting: Family Medicine

## 2016-11-08 DIAGNOSIS — M25512 Pain in left shoulder: Secondary | ICD-10-CM

## 2016-11-09 NOTE — Telephone Encounter (Signed)
Dr, Carlota Raspberry,   Stephen Compton stated he is taking the mobic everyday, I advised him that you wanted him to see orthopedic if the pain persist.   He would like more Mobic until this is done.    Please Advise

## 2016-11-11 NOTE — Telephone Encounter (Signed)
Refilled for 1 month, but advised by Mychart message to follow up with ortho if needed beyond that time.

## 2016-12-11 ENCOUNTER — Other Ambulatory Visit: Payer: Self-pay | Admitting: Family Medicine

## 2016-12-11 ENCOUNTER — Other Ambulatory Visit: Payer: Self-pay

## 2016-12-11 DIAGNOSIS — M25512 Pain in left shoulder: Secondary | ICD-10-CM

## 2016-12-11 MED ORDER — MELOXICAM 7.5 MG PO TABS: 7.5000 mg | ORAL_TABLET | Freq: Every day | ORAL | 0 refills | Status: DC

## 2016-12-11 NOTE — Telephone Encounter (Signed)
Pt requesting another refill of meloxicam. Per OV 6/18 may need referral to orthopedist. Please advise on refill.

## 2016-12-14 ENCOUNTER — Other Ambulatory Visit: Payer: Self-pay | Admitting: Family Medicine

## 2016-12-14 DIAGNOSIS — M25512 Pain in left shoulder: Secondary | ICD-10-CM

## 2017-01-21 ENCOUNTER — Other Ambulatory Visit: Payer: Self-pay | Admitting: Family Medicine

## 2017-01-21 ENCOUNTER — Encounter: Payer: Self-pay | Admitting: Family Medicine

## 2017-01-21 DIAGNOSIS — M25512 Pain in left shoulder: Secondary | ICD-10-CM

## 2017-11-10 DIAGNOSIS — Z0000 Encounter for general adult medical examination without abnormal findings: Secondary | ICD-10-CM | POA: Diagnosis not present

## 2018-02-16 DIAGNOSIS — F17200 Nicotine dependence, unspecified, uncomplicated: Secondary | ICD-10-CM | POA: Diagnosis not present

## 2018-02-18 DIAGNOSIS — M65331 Trigger finger, right middle finger: Secondary | ICD-10-CM | POA: Diagnosis not present

## 2018-04-15 ENCOUNTER — Ambulatory Visit: Payer: 59 | Admitting: Family Medicine

## 2018-04-15 ENCOUNTER — Other Ambulatory Visit: Payer: Self-pay

## 2018-04-15 ENCOUNTER — Encounter: Payer: Self-pay | Admitting: Family Medicine

## 2018-04-15 VITALS — BP 120/76 | HR 83 | Temp 98.9°F | Resp 18 | Ht 74.0 in | Wt 269.6 lb

## 2018-04-15 DIAGNOSIS — R7989 Other specified abnormal findings of blood chemistry: Secondary | ICD-10-CM

## 2018-04-15 DIAGNOSIS — R6882 Decreased libido: Secondary | ICD-10-CM

## 2018-04-15 NOTE — Progress Notes (Signed)
Subjective:    Patient ID: Stephen Compton, male    DOB: [birth year], 38 y.o.   MRN: [id number]  HPI Stephen Compton is a 38 y.o. male Presents today for: Chief Complaint  Patient presents with  . Follow-up    had labs done 1 mon ago and it stated testerorone levels were low and here to talk to Dr Carlota Raspberry   Here for evaluation of abnormal labs performed by psychiatrist.  Reportedly had low testosterone level drawn around 11 AM. Psychiatry ordered labs prior to visit 2 weeks ago. ?174. Labs not available at this time for review.   On chart review he did have testosterone testing in August 2016 as well as September 2014, July 2014. Total on 09/01/2012 was 233 with normal free testosterone. Total on October 24, 2012 was 354 with normal free testosterone. Total on 09/20/2014 was 331 with normal free testosterone.  Note reviewed at that time with Dr. Wendi Snipes, some decreased libido noted at that time. Did take enanthate, cypionate and propionate when body building about 10 years ago. No recent use of steroids, no use of testosterone or DHEA, no prior Rx testosterone.   Able to obtain erection, able to maintain erection to completion.  Some decreased libido.  Patient Active Problem List   Diagnosis Date Noted  . Fatigue 09/20/2014  . Bipolar disorder ([place]) 09/15/2012  . Libido, decreased 09/15/2012  . Anxiety associated with depression 02/20/2010  . Snuff user 02/20/2010  . ASTHMA 02/20/2010   Past Medical History:  Diagnosis Date  . Anxiety and depression   . Asthma    Past Surgical History:  Procedure Laterality Date  . ROTATOR CUFF REPAIR Right 2017  . WISDOM TOOTH EXTRACTION     Allergies  Allergen Reactions  . Wellbutrin [Bupropion]     Experienced suicidal ideation'; complete resolution off medication  . Latuda [Lurasidone Hcl] Other (See Comments)    Suicidal thoughts   Prior to Admission medications   Medication Sig Start Date End Date Taking? Authorizing Provider   clonazePAM (KLONOPIN) 1 MG tablet TAKE 1 OR 1&1/2 TABLETS BY MOUTH EVERY DAY AS NEEDED FOR ANXIETY 08/29/12  Yes Hendricks Limes, MD  OLANZapine (ZYPREXA) 5 MG tablet Take 5 mg by mouth at bedtime.   Yes [provider]  PARoxetine (PAXIL) 20 MG tablet Take 1 tablet (20 mg total) by mouth daily. 01/15/15  Yes Timmothy Euler, MD   Social History   Socioeconomic History  . Marital status: Legally Separated    Spouse name: Not on file  . Number of children: Not on file  . Years of education: Not on file  . Highest education level: Not on file  Occupational History  . Not on file  Social Needs  . Financial resource strain: Not on file  . Food insecurity:    Worry: Not on file    Inability: Not on file  . Transportation needs:    Medical: Not on file    Non-medical: Not on file  Tobacco Use  . Smoking status: Former Smoker    Last attempt to quit: 02/02/2002    Years since quitting: 16.2  . Smokeless tobacco: Current User    Types: Snuff  . Tobacco comment: 1ppd ages [age]; 1 can tobacco every 3 days  Substance and Sexual Activity  . Alcohol use: Yes    Alcohol/week: 1.0 standard drinks    Types: 1 Cans of beer per week    Comment:  intermittent  drinker  .  Drug use: No  . Sexual activity: Never  Lifestyle  . Physical activity:    Days per week: Not on file    Minutes per session: Not on file  . Stress: Not on file  Relationships  . Social connections:    Talks on phone: Not on file    Gets together: Not on file    Attends religious service: Not on file    Active member of club or organization: Not on file    Attends meetings of clubs or organizations: Not on file    Relationship status: Not on file  . Intimate partner violence:    Fear of current or ex partner: Not on file    Emotionally abused: Not on file    Physically abused: Not on file    Forced sexual activity: Not on file  Other Topics Concern  . Not on file  Social History Narrative  . Not on  file    Review of Systems Per HPI.     Objective:   Physical Exam Constitutional:      General: He is not in acute distress.    Appearance: He is well-developed.  HENT:     Head: Normocephalic and atraumatic.  Neck:     Thyroid: No thyromegaly.  Cardiovascular:     Rate and Rhythm: Normal rate and regular rhythm.     Heart sounds: Normal heart sounds. No murmur.  Pulmonary:     Effort: Pulmonary effort is normal.     Breath sounds: Normal breath sounds.  Lymphadenopathy:     Cervical: No cervical adenopathy.  Neurological:     Mental Status: He is alert and oriented to person, place, and time.    Vitals:   04/15/18 1513  BP: 120/76  Pulse: 83  Resp: 18  Temp: 98.9 F (37.2 C)  TempSrc: Oral  SpO2: 96%  Weight: 269 lb 9.6 oz (122.3 kg)  Height: 6\' 2"  (1.88 m)       Assessment & Plan:  Stephen Compton is a 38 y.o. male Low testosterone in male - Plan: Testosterone, Free, Total, SHBG, CANCELED: Testosterone, Free, Total, SHBG  Decreased libido - Plan: Testosterone, Free, Total, SHBG, CANCELED: Testosterone, Free, Total, SHBG  Decreased libido with reported low testosterone at outside office.  Will try to obtain his records, but discussed need for repeat testing between 8 and 10 in the morning, possible second verification if low.  Depending on how low that number is, may need further testing with endocrinology or pituitary imaging.  Will return for lab only visit.  No orders of the defined types were placed in this encounter.  Patient Instructions     Please return at your convenience for a lab only visit, that should be between 8 and 10 am.  Depending on that reading we can discuss the next step which may be either repeat testing or possible referral to specialist.  Thank you for coming in today.   If you have lab work done today you will be contacted with your lab results within the next 2 weeks.  If you have not heard from [place] then please contact us. The fastest  way to get your results is to register for My Chart.   IF you received an x-ray today, you will receive an invoice from [facility]. Please contact [facility] at [phone] with questions or concerns regarding your invoice.   IF you received labwork today, you will receive an invoice from [place]. Please contact  [facility] at [phone] with questions or concerns regarding your invoice.   Our billing staff will not be able to assist you with questions regarding bills from these companies.  You will be contacted with the lab results as soon as they are available. The fastest way to get your results is to activate your My Chart account. Instructions are located on the last page of this paperwork. If you have not heard from [place] regarding the results in 2 weeks, please contact this office.       Signed,   [name], MD Primary [facility] at [place].  04/17/18 11:37 AM

## 2018-04-15 NOTE — Patient Instructions (Addendum)
   Please return at your convenience for a lab only visit, that should be between 8 and 10 am.  Depending on that reading we can discuss the next step which may be either repeat testing or possible referral to specialist.  Thank you for coming in today.   If you have lab work done today you will be contacted with your lab results within the next 2 weeks.  If you have not heard from [place] then please contact us. The fastest way to get your results is to register for My Chart.   IF you received an x-ray today, you will receive an invoice from [facility]. Please contact [facility] at [phone] with questions or concerns regarding your invoice.   IF you received labwork today, you will receive an invoice from [place]. Please contact [facility] at [phone] with questions or concerns regarding your invoice.   Our billing staff will not be able to assist you with questions regarding bills from these companies.  You will be contacted with the lab results as soon as they are available. The fastest way to get your results is to activate your My Chart account. Instructions are located on the last page of this paperwork. If you have not heard from [place] regarding the results in 2 weeks, please contact this office.

## 2018-04-17 ENCOUNTER — Encounter: Payer: Self-pay | Admitting: Family Medicine

## 2018-05-29 IMAGING — DX DG FOOT COMPLETE 3+V*R*
3 series · 3 of 3 positions shown · non-contrast
Comparison: None.

CLINICAL DATA: Right foot pain following fall 1 month ago. Initial
encounter.

EXAM:
RIGHT FOOT COMPLETE - 3+ VIEW

[foot ap]
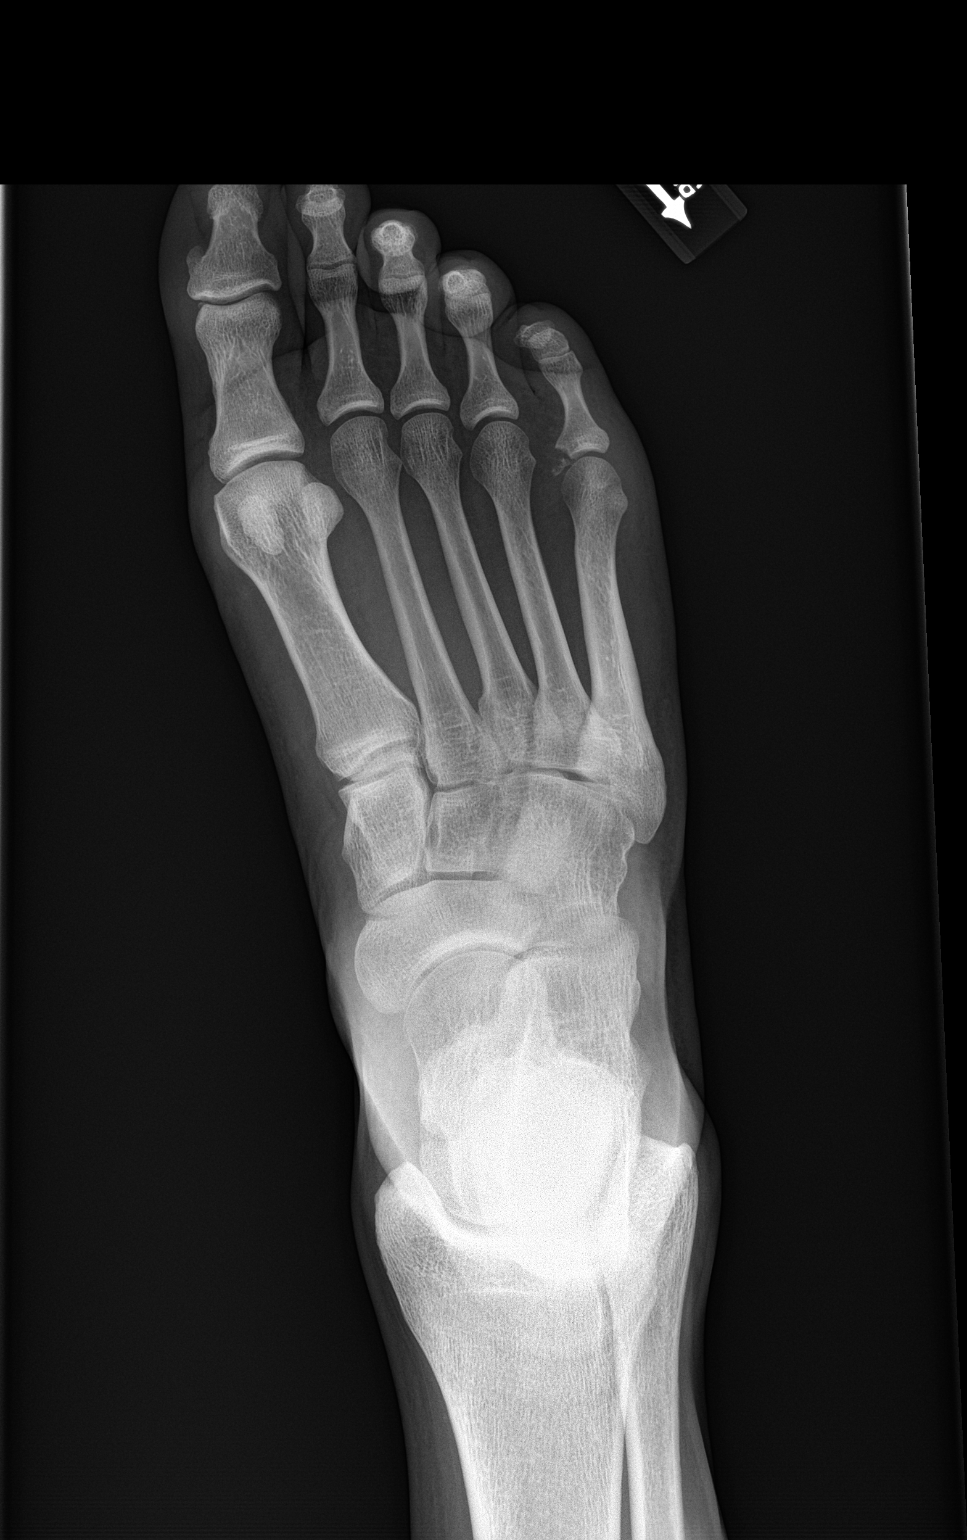

[foot obl]
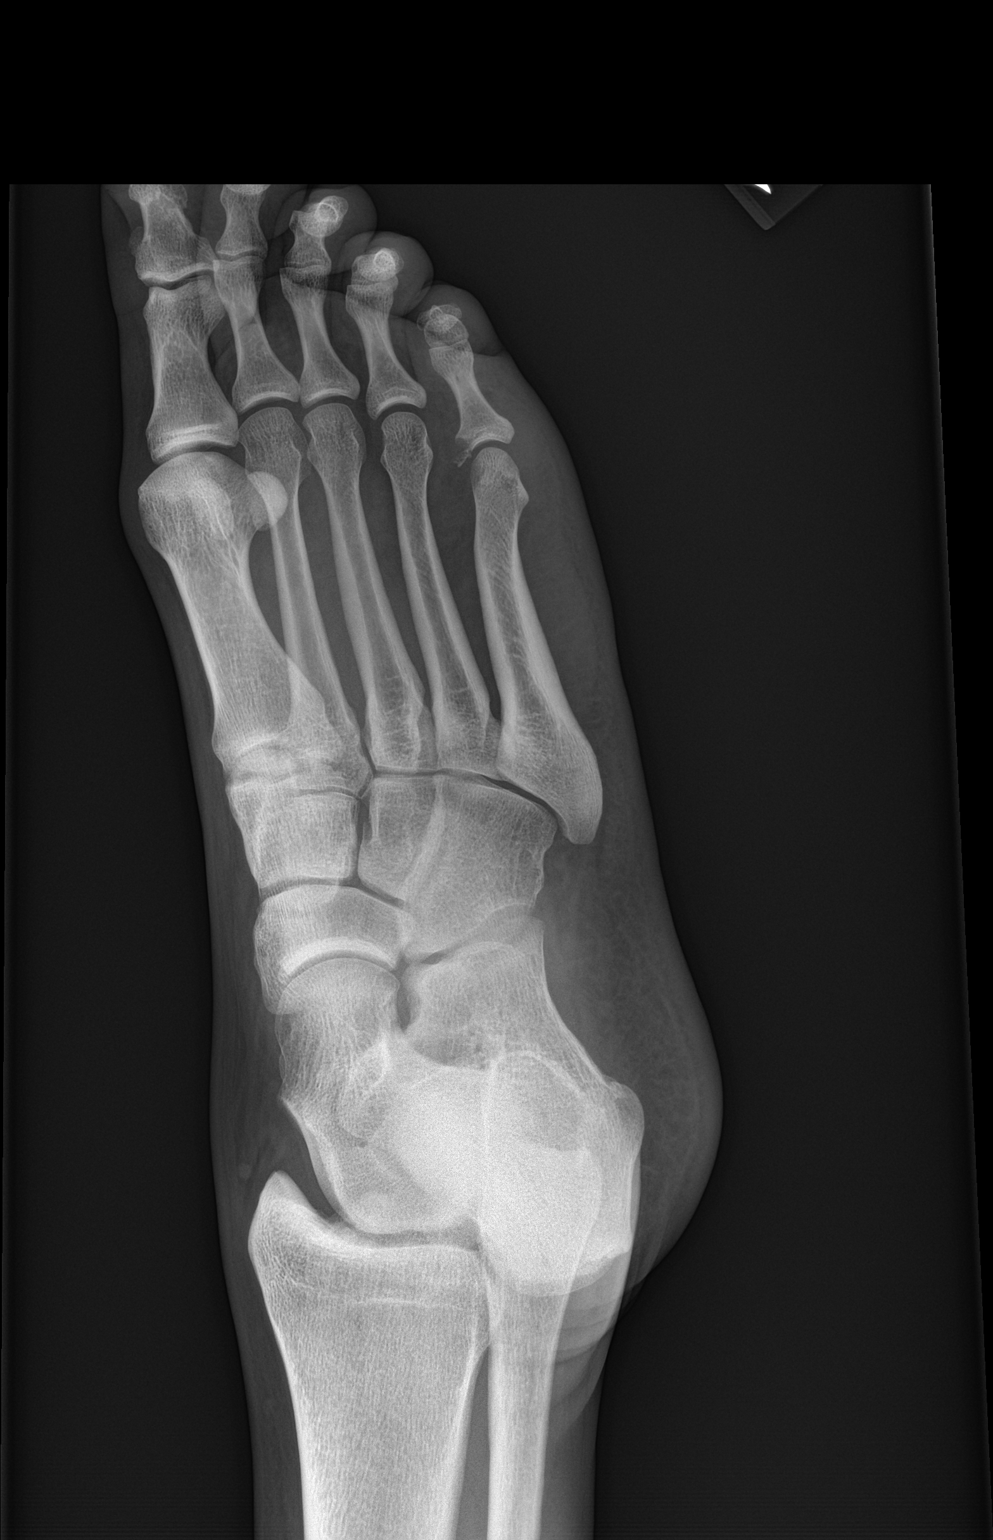

[foot lat]
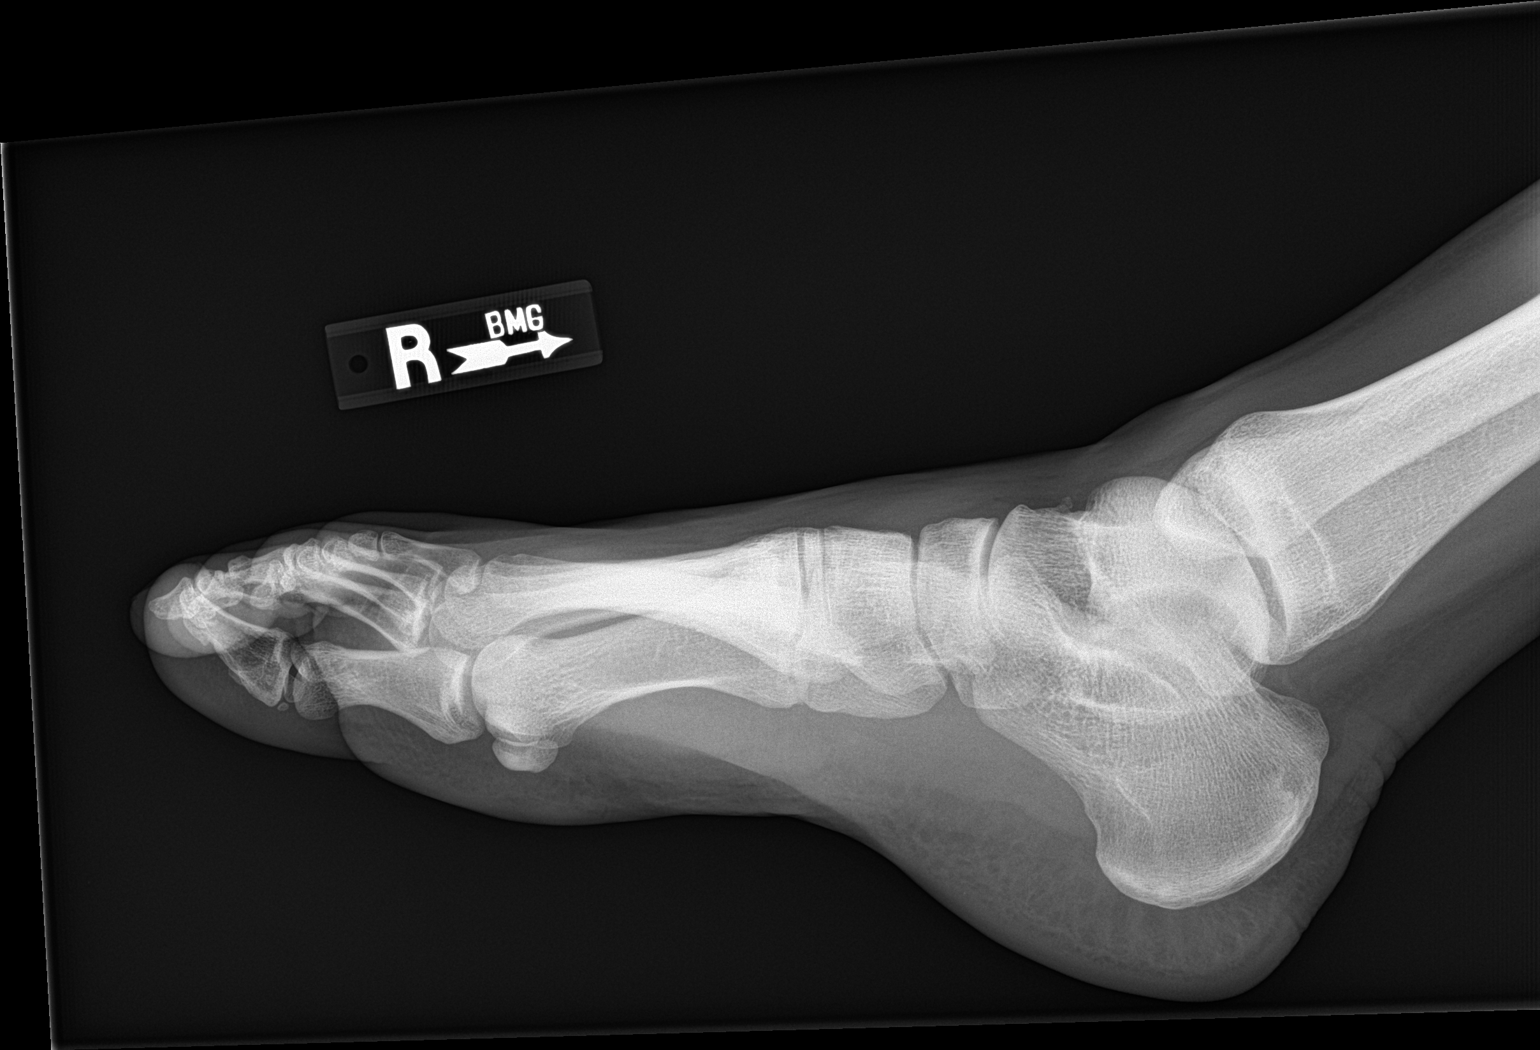

[3 of 3 positions shown; findings below may reference images not displayed]

FINDINGS: There is a fracture at the medial base of the little toe proximal
phalanx.

There is no evidence of subluxation or dislocation.

No other fractures are noted.

The Lisfranc joints are unremarkable.
IMPRESSION: Fracture at the medial base of the little toe proximal phalanx.

## 2018-07-15 ENCOUNTER — Other Ambulatory Visit: Payer: Self-pay | Admitting: Orthopedic Surgery

## 2018-08-09 ENCOUNTER — Other Ambulatory Visit: Payer: Self-pay

## 2018-08-09 ENCOUNTER — Encounter (HOSPITAL_BASED_OUTPATIENT_CLINIC_OR_DEPARTMENT_OTHER): Payer: Self-pay | Admitting: *Deleted

## 2018-08-12 ENCOUNTER — Other Ambulatory Visit (HOSPITAL_COMMUNITY)
Admission: RE | Admit: 2018-08-12 | Discharge: 2018-08-12 | Disposition: A | Discharge status: Home / Self Care | Payer: 59 | Source: Ambulatory Visit | Attending: Orthopedic Surgery | Admitting: Orthopedic Surgery

## 2018-08-12 DIAGNOSIS — Z1159 Encounter for screening for other viral diseases: Secondary | ICD-10-CM | POA: Insufficient documentation

## 2018-08-12 DIAGNOSIS — Z01812 Encounter for preprocedural laboratory examination: Secondary | ICD-10-CM | POA: Diagnosis present

## 2018-08-12 LAB — SARS CORONAVIRUS 2 (TAT 6-24 HRS): SARS Coronavirus 2: NEGATIVE

## 2018-08-16 ENCOUNTER — Encounter (HOSPITAL_BASED_OUTPATIENT_CLINIC_OR_DEPARTMENT_OTHER)
Admission: RE | Disposition: A | Discharge status: Home / Self Care | Payer: Self-pay | Source: Home / Self Care | Attending: Orthopedic Surgery

## 2018-08-16 ENCOUNTER — Ambulatory Visit (HOSPITAL_BASED_OUTPATIENT_CLINIC_OR_DEPARTMENT_OTHER)
Admission: RE | Admit: 2018-08-16 | Discharge: 2018-08-16 | Disposition: A | Discharge status: Home / Self Care | Payer: 59 | Attending: Orthopedic Surgery | Admitting: Orthopedic Surgery

## 2018-08-16 ENCOUNTER — Other Ambulatory Visit: Payer: Self-pay

## 2018-08-16 ENCOUNTER — Ambulatory Visit (HOSPITAL_BASED_OUTPATIENT_CLINIC_OR_DEPARTMENT_OTHER): Payer: 59 | Admitting: Anesthesiology

## 2018-08-16 ENCOUNTER — Encounter (HOSPITAL_BASED_OUTPATIENT_CLINIC_OR_DEPARTMENT_OTHER): Payer: Self-pay | Admitting: Anesthesiology

## 2018-08-16 DIAGNOSIS — F319 Bipolar disorder, unspecified: Secondary | ICD-10-CM | POA: Diagnosis not present

## 2018-08-16 DIAGNOSIS — F419 Anxiety disorder, unspecified: Secondary | ICD-10-CM | POA: Diagnosis not present

## 2018-08-16 DIAGNOSIS — M65841 Other synovitis and tenosynovitis, right hand: Secondary | ICD-10-CM | POA: Insufficient documentation

## 2018-08-16 DIAGNOSIS — M65331 Trigger finger, right middle finger: Secondary | ICD-10-CM | POA: Diagnosis not present

## 2018-08-16 DIAGNOSIS — Z87891 Personal history of nicotine dependence: Secondary | ICD-10-CM | POA: Insufficient documentation

## 2018-08-16 HISTORY — PX: TRIGGER FINGER RELEASE: SHX641

## 2018-08-16 SURGERY — RELEASE, A1 PULLEY, FOR TRIGGER FINGER
Anesthesia: General | Site: Hand | Laterality: Right

## 2018-08-16 MED ORDER — FENTANYL CITRATE (PF) 100 MCG/2ML IJ SOLN: 25.0000 ug | INTRAMUSCULAR | Status: DC | PRN

## 2018-08-16 MED ORDER — CHLORHEXIDINE GLUCONATE 4 % EX LIQD: 60.0000 mL | Freq: Once | CUTANEOUS | Status: DC

## 2018-08-16 MED ORDER — PROPOFOL 500 MG/50ML IV EMUL
INTRAVENOUS | Status: AC
  Filled 2018-08-16: qty 50

## 2018-08-16 MED ORDER — DEXAMETHASONE SODIUM PHOSPHATE 10 MG/ML IJ SOLN
INTRAMUSCULAR | Status: DC | PRN
  Administered 2018-08-16: 10 mg via INTRAVENOUS

## 2018-08-16 MED ORDER — BUPIVACAINE HCL (PF) 0.25 % IJ SOLN
INTRAMUSCULAR | Status: AC
  Filled 2018-08-16: qty 30

## 2018-08-16 MED ORDER — LIDOCAINE HCL (PF) 1 % IJ SOLN
INTRAMUSCULAR | Status: AC
  Filled 2018-08-16: qty 5

## 2018-08-16 MED ORDER — FENTANYL CITRATE (PF) 100 MCG/2ML IJ SOLN
INTRAMUSCULAR | Status: AC
  Filled 2018-08-16: qty 2

## 2018-08-16 MED ORDER — OXYCODONE HCL 5 MG/5ML PO SOLN: 5.0000 mg | Freq: Once | ORAL | Status: DC | PRN

## 2018-08-16 MED ORDER — LACTATED RINGERS IV SOLN
INTRAVENOUS | Status: DC
  Administered 2018-08-16: 08:00:00 via INTRAVENOUS

## 2018-08-16 MED ORDER — CEFAZOLIN SODIUM-DEXTROSE 2-4 GM/100ML-% IV SOLN
INTRAVENOUS | Status: AC
  Filled 2018-08-16: qty 100

## 2018-08-16 MED ORDER — LIDOCAINE 2% (20 MG/ML) 5 ML SYRINGE
INTRAMUSCULAR | Status: DC | PRN
  Administered 2018-08-16: 100 mg via INTRAVENOUS

## 2018-08-16 MED ORDER — ACETAMINOPHEN 160 MG/5ML PO SOLN: 1000.0000 mg | Freq: Once | ORAL | Status: DC | PRN

## 2018-08-16 MED ORDER — SCOPOLAMINE 1 MG/3DAYS TD PT72: 1.0000 | MEDICATED_PATCH | Freq: Once | TRANSDERMAL | Status: DC

## 2018-08-16 MED ORDER — CEFAZOLIN SODIUM-DEXTROSE 2-4 GM/100ML-% IV SOLN
2.0000 g | INTRAVENOUS | Status: AC
  Administered 2018-08-16: 09:00:00 2 g via INTRAVENOUS

## 2018-08-16 MED ORDER — ACETAMINOPHEN 500 MG PO TABS: 1000.0000 mg | ORAL_TABLET | Freq: Once | ORAL | Status: DC | PRN

## 2018-08-16 MED ORDER — MIDAZOLAM HCL 2 MG/2ML IJ SOLN
INTRAMUSCULAR | Status: AC
  Filled 2018-08-16: qty 2

## 2018-08-16 MED ORDER — FENTANYL CITRATE (PF) 100 MCG/2ML IJ SOLN
50.0000 ug | INTRAMUSCULAR | Status: DC | PRN
  Administered 2018-08-16: 100 ug via INTRAVENOUS

## 2018-08-16 MED ORDER — OXYCODONE HCL 5 MG PO TABS: 5.0000 mg | ORAL_TABLET | Freq: Once | ORAL | Status: DC | PRN

## 2018-08-16 MED ORDER — PROPOFOL 10 MG/ML IV BOLUS
INTRAVENOUS | Status: DC | PRN
  Administered 2018-08-16: 250 mg via INTRAVENOUS

## 2018-08-16 MED ORDER — MIDAZOLAM HCL 2 MG/2ML IJ SOLN
1.0000 mg | INTRAMUSCULAR | Status: DC | PRN
  Administered 2018-08-16: 2 mg via INTRAVENOUS

## 2018-08-16 MED ORDER — ACETAMINOPHEN 10 MG/ML IV SOLN: 1000.0000 mg | Freq: Once | INTRAVENOUS | Status: DC | PRN

## 2018-08-16 MED ORDER — TRAMADOL HCL 50 MG PO TABS: 50.0000 mg | ORAL_TABLET | Freq: Four times a day (QID) | ORAL | 0 refills | Status: DC | PRN

## 2018-08-16 MED ORDER — BUPIVACAINE HCL (PF) 0.25 % IJ SOLN
INTRAMUSCULAR | Status: DC | PRN
  Administered 2018-08-16: 5 mL

## 2018-08-16 SURGICAL SUPPLY — 33 items
APL PRP STRL LF DISP 70% ISPRP (MISCELLANEOUS) ×1
BLADE SURG 15 STRL LF DISP TIS (BLADE) ×1 IMPLANT
BLADE SURG 15 STRL SS (BLADE) ×2
BNDG CMPR 9X4 STRL LF SNTH (GAUZE/BANDAGES/DRESSINGS) ×1
BNDG COHESIVE 2X5 TAN STRL LF (GAUZE/BANDAGES/DRESSINGS) ×2 IMPLANT
BNDG ESMARK 4X9 LF (GAUZE/BANDAGES/DRESSINGS) ×1 IMPLANT
CHLORAPREP W/TINT 26 (MISCELLANEOUS) ×2 IMPLANT
CORD BIPOLAR FORCEPS 12FT (ELECTRODE) ×1 IMPLANT
COVER BACK TABLE REUSABLE LG (DRAPES) ×2 IMPLANT
COVER MAYO STAND REUSABLE (DRAPES) ×2 IMPLANT
COVER WAND RF STERILE (DRAPES) IMPLANT
CUFF TOURN SGL QUICK 18X4 (TOURNIQUET CUFF) ×1 IMPLANT
DECANTER SPIKE VIAL GLASS SM (MISCELLANEOUS) IMPLANT
DRAPE EXTREMITY T 121X128X90 (DISPOSABLE) ×2 IMPLANT
DRAPE SURG 17X23 STRL (DRAPES) ×2 IMPLANT
GAUZE SPONGE 4X4 12PLY STRL (GAUZE/BANDAGES/DRESSINGS) ×2 IMPLANT
GAUZE XEROFORM 1X8 LF (GAUZE/BANDAGES/DRESSINGS) ×2 IMPLANT
GLOVE BIOGEL PI IND STRL 8.5 (GLOVE) ×1 IMPLANT
GLOVE BIOGEL PI INDICATOR 8.5 (GLOVE) ×1
GLOVE SURG ORTHO 8.0 STRL STRW (GLOVE) ×3 IMPLANT
GOWN STRL REUS W/ TWL LRG LVL3 (GOWN DISPOSABLE) ×1 IMPLANT
GOWN STRL REUS W/TWL LRG LVL3 (GOWN DISPOSABLE) ×2
GOWN STRL REUS W/TWL XL LVL3 (GOWN DISPOSABLE) ×2 IMPLANT
NDL PRECISIONGLIDE 27X1.5 (NEEDLE) ×1 IMPLANT
NEEDLE PRECISIONGLIDE 27X1.5 (NEEDLE) ×2 IMPLANT
NS IRRIG 1000ML POUR BTL (IV SOLUTION) ×2 IMPLANT
PACK BASIN DAY SURGERY FS (CUSTOM PROCEDURE TRAY) ×2 IMPLANT
STOCKINETTE 4X48 STRL (DRAPES) ×2 IMPLANT
SUT ETHILON 4 0 PS 2 18 (SUTURE) ×2 IMPLANT
SYR BULB 3OZ (MISCELLANEOUS) ×2 IMPLANT
SYR CONTROL 10ML LL (SYRINGE) ×2 IMPLANT
TOWEL GREEN STERILE FF (TOWEL DISPOSABLE) ×4 IMPLANT
UNDERPAD 30X30 (UNDERPADS AND DIAPERS) ×2 IMPLANT

## 2018-08-16 NOTE — Brief Op Note (Signed)
08/16/2018  9:11 AM  PATIENT:  Stephen Compton  38 y.o. male  PRE-OPERATIVE DIAGNOSIS:  TRIFFER RIGHT MIDDLE FINGER  POST-OPERATIVE DIAGNOSIS:  TRIGGER RIGHT MIDDLE FINGER  PROCEDURE:  Procedure(s) with comments: RELEASE TRIGGER FINGER/A-1 PULLEY RIGHT MIDDLE FINGER (Right) - FAB  SURGEON:  Surgeon(s) and Role:    * Daryll Brod, MD - Primary  PHYSICIAN ASSISTANT:   ASSISTANTS: none   ANESTHESIA:   local and general  EBL: 27ml BLOOD ADMINISTERED:none  DRAINS: none   LOCAL MEDICATIONS USED:  BUPIVICAINE   SPECIMEN:  No Specimen  DISPOSITION OF SPECIMEN:  N/A  COUNTS:  YES  TOURNIQUET:  * Missing tourniquet times found for documented tourniquets in log: [id number] *  DICTATION: .Dragon Dictation  PLAN OF CARE: Discharge to home after PACU  PATIENT DISPOSITION:  PACU - hemodynamically stable.

## 2018-08-16 NOTE — H&P (Signed)
  Stephen Compton is an 38 y.o. male.   Chief Complaint: catching right middle fingerHPI: Stephen Compton is a 38 yo male complaining of his triggering right middle finger. He has had 2 injections. He states that the triggering has returned. He states that it frequently awakens him up at night with it locked and having to use his opposite hand to straighten it out. He states this began approximately 6 weeks ago. He has no history of injury. He has no history of diabetes thyroid problems arthritis or gout. Family history is negative for to these also.    Past Medical History:  Diagnosis Date  . Anxiety and depression   . Asthma     Past Surgical History:  Procedure Laterality Date  . ROTATOR CUFF REPAIR Right 2017  . WISDOM TOOTH EXTRACTION      Family History  Problem Relation Age of Onset  . Thyroid disease Mother   . Depression Father   . Alcohol abuse Father   . Hypertension Brother   . Breast cancer Maternal Grandmother   . Cancer Maternal Grandfather        ? primary  . Cancer Paternal Grandmother        ? primary  . Alcohol abuse Paternal Grandmother   . Cancer Paternal Grandfather        ? primary  . Alcohol abuse Paternal Grandfather   . Depression Sister   . Depression Paternal Aunt        two  . Alcohol abuse Paternal Aunt   . Depression Paternal Uncle   . Bipolar disorder Other        no FH but most likely present(see above)  . Heart disease Neg Hx   . Stroke Neg Hx    Social History:  reports that he quit smoking about 16 years ago. His smokeless tobacco use includes snuff. He reports current alcohol use of about 1.0 standard drinks of alcohol per week. He reports that he does not use drugs.  Allergies:  Allergies  Allergen Reactions  . Wellbutrin [Bupropion]     Experienced suicidal ideation'; complete resolution off medication  . Latuda [Lurasidone Hcl] Other (See Comments)    Suicidal thoughts    No medications prior to admission.    No results found for  this or any previous visit (from the past 48 hour(s)).  No results found.   Pertinent items are noted in HPI.  Height 6\' 2"  (1.88 m), weight 117.9 kg.  General appearance: alert, cooperative and appears stated age Head: Normocephalic, without obvious abnormality Neck: no JVD Resp: clear to auscultation bilaterally Cardio: regular rate and rhythm, S1, S2 normal, no murmur, click, rub or gallop GI: soft, non-tender; bowel sounds normal; no masses,  no organomegaly Extremities:  catching right middle finger Pulses: 2+ and symmetric Skin: Skin color, texture, turgor normal. No rashes or lesions Neurologic: Grossly normal Incision/Wound: na  Assessment/Plan Assessment:  1. Trigger middle finger of right hand    Plan: He would like to have this surgically released. Pre-peri-and postoperative course are discussed along with risk complications. He is aware there is no guarantee to the surgery the possibility of infection recurrence injury to arteries nerves tendons complete relief symptoms dystrophy. Schedule as an outpatient under regional anesthesia right middle finger A1 pulley release.   Daryll Brod 08/16/2018, 5:36 AM

## 2018-08-16 NOTE — Anesthesia Preprocedure Evaluation (Signed)
Anesthesia Evaluation  Patient identified by MRN, date of birth, ID band Patient awake    Reviewed: Allergy & Precautions, NPO status , Patient's Chart, lab work & pertinent test results  History of Anesthesia Complications Negative for: history of anesthetic complications  Airway Mallampati: II  TM Distance: >3 FB Neck ROM: Full    Dental  (+) Dental Advisory Given   Pulmonary asthma , neg recent URI, former smoker,    breath sounds clear to auscultation       Cardiovascular negative cardio ROS   Rhythm:Regular     Neuro/Psych PSYCHIATRIC DISORDERS Anxiety Depression Bipolar Disorder    GI/Hepatic negative GI ROS, Neg liver ROS,   Endo/Other  negative endocrine ROS  Renal/GU negative Renal ROS     Musculoskeletal TRIGGER FINGER/A-1 PULLEY RIGHT MIDDLE FINGER (Right    Abdominal   Peds  Hematology negative hematology ROS (+)   Anesthesia Other Findings   Reproductive/Obstetrics                             Anesthesia Physical Anesthesia Plan  ASA: II  Anesthesia Plan: General   Post-op Pain Management:    Induction: Intravenous  PONV Risk Score and Plan: 2 and Ondansetron and Dexamethasone  Airway Management Planned: LMA  Additional Equipment: None  Intra-op Plan:   Post-operative Plan: Extubation in OR  Informed Consent: I have reviewed the patients History and Physical, chart, labs and discussed the procedure including the risks, benefits and alternatives for the proposed anesthesia with the patient or authorized representative who has indicated his/her understanding and acceptance.     Dental advisory given  Plan Discussed with: Surgeon and CRNA  Anesthesia Plan Comments:         Anesthesia Quick Evaluation

## 2018-08-16 NOTE — Discharge Instructions (Addendum)
Hand Center Instructions Hand Surgery  Wound Care: Keep your hand elevated above the level of your heart.  Do not allow it to dangle by your side.  Keep the dressing dry and do not remove it unless your doctor advises you to do so.  He will usually change it at the time of your post-op visit.  Moving your fingers is advised to stimulate circulation but will depend on the site of your surgery.  If you have a splint applied, your doctor will advise you regarding movement.  Activity: Do not drive or operate machinery today.  Rest today and then you may return to your normal activity and work as indicated by your physician.  Diet:  Drink liquids today or eat a light diet.  You may resume a regular diet tomorrow.    General expectations: Pain for two to three days. Fingers may become slightly swollen.  Call your doctor if any of the following occur: Severe pain not relieved by pain medication. Elevated temperature. Dressing soaked with blood. Inability to move fingers. White or bluish color to fingers. Excuse from Work/School  Please excuse Stephen Compton from work/school beginning today for 1 weeks. He may then return to work/school with the following restrictions: No use right hand.     Post Anesthesia Home Care Instructions  Activity: Get plenty of rest for the remainder of the day. A responsible individual must stay with you for 24 hours following the procedure.  For the next 24 hours, DO NOT: -Drive a car -Paediatric nurse -Drink alcoholic beverages -Take any medication unless instructed by your physician -Make any legal decisions or sign important papers.  Meals: Start with liquid foods such as gelatin or soup. Progress to regular foods as tolerated. Avoid greasy, spicy, heavy foods. If nausea and/or vomiting occur, drink only clear liquids until the nausea and/or vomiting subsides. Call your physician if vomiting continues.  Special Instructions/Symptoms: Your throat  may feel dry or sore from the anesthesia or the breathing tube placed in your throat during surgery. If this causes discomfort, gargle with warm salt water. The discomfort should disappear within 24 hours.  If you had a scopolamine patch placed behind your ear for the management of post- operative nausea and/or vomiting:  1. The medication in the patch is effective for 72 hours, after which it should be removed.  Wrap patch in a tissue and discard in the trash. Wash hands thoroughly with soap and water. 2. You may remove the patch earlier than 72 hours if you experience unpleasant side effects which may include dry mouth, dizziness or visual disturbances. 3. Avoid touching the patch. Wash your hands with soap and water after contact with the patch.

## 2018-08-16 NOTE — Op Note (Signed)
NAME: Stephen Compton MEDICAL RECORD NO: [id number] DATE OF BIRTH: 02-25-1980 FACILITY: [facility] LOCATION:  SURGERY CENTER PHYSICIAN: Wynonia Sours, MD   OPERATIVE REPORT   DATE OF PROCEDURE: 08/16/18    PREOPERATIVE DIAGNOSIS:   Stenosing tenosynovitis right middle finger   POSTOPERATIVE DIAGNOSIS:   Same   PROCEDURE:   Release A1 pulley right middle finger   SURGEON: Daryll Brod, M.D.   ASSISTANT: none   ANESTHESIA:  General and Local   INTRAVENOUS FLUIDS:  Per anesthesia flow sheet.   ESTIMATED BLOOD LOSS:  Minimal.   COMPLICATIONS:  None.   SPECIMENS:  none   TOURNIQUET TIME:   * Missing tourniquet times found for documented tourniquets in log: [id number] *   DISPOSITION:  Stable to PACU.   INDICATIONS: Patient is a 38 year old male with history of triggering of his right middle finger.  This has not responded to conservative treatment.  He has elected undergo surgical release of the A1 pulley.  Pre-peri-and postoperative course been discussed along with risks and complications.  He is aware that there is no guarantee to the surgery the possibility of infection recurrence injury to arteries nerves tendons incomplete relief of symptoms and dystrophy.  In the preoperative area the patient is seen the extremity marked by both patient and surgeon antibiotic given  OPERATIVE COURSE: Patient is brought to the operating room where adequate general anesthetic was carried out without difficulty under the direction of the anesthesia department.  Was prepped using ChloraPrep and in supine position with the right arm free.  A three-minute dry time was allowed and a timeout taken confirming patient and procedure.  The arm was exsanguinated with an Esmarch bandage turn placed on the forearm was inflated to 250 mmHg.  An oblique incision was made over the metacarpal phalangeal joint of the right hand middle finger.  This carried down through subcutaneous tissue.  The neurovascular  bundles radially and ulnarly were identified and retracted with retractors.  The A1 pulley was then isolated this was released on its radial aspect a small incision was made centrally and A2 partial tenosynovectomy performed proximally.  The 2 flexor tendons were then separated breaking any adhesions between the 2 tendons.  The finger was placed through full passive range of motion and no further triggering was noted.  The wound was copiously irrigated with saline.  The skin was closed interrupted 4-0 nylon sutures.  A local infiltration quarter percent bupivacaine without epinephrine approximately 7 cc was used.  Sterile compressive dressing with the fingers free was applied.  Deflation of the tourniquet all fingers immediately pink.  He was taken to the recovery room for observation in satisfactory condition.  He will be discharged home to return the hand center of [facility] in 1 week on Tylenol with ibuprofen and Ultram as a breakthrough backup.   Daryll Brod, MD Electronically signed, 08/16/18

## 2018-08-16 NOTE — Anesthesia Procedure Notes (Signed)
Procedure Name: LMA Insertion Date/Time: 08/16/2018 8:48 AM Performed by: Maryella Shivers, CRNA Pre-anesthesia Checklist: Patient identified, Emergency Drugs available, Suction available and Patient being monitored Patient Re-evaluated:Patient Re-evaluated prior to induction Oxygen Delivery Method: Circle system utilized Preoxygenation: Pre-oxygenation with 100% oxygen Induction Type: IV induction Ventilation: Mask ventilation without difficulty LMA: LMA inserted LMA Size: 5.0 Number of attempts: 1 Airway Equipment and Method: Bite block Placement Confirmation: positive ETCO2 Tube secured with: Tape Dental Injury: Teeth and Oropharynx as per pre-operative assessment

## 2018-08-16 NOTE — Transfer of Care (Signed)
Immediate Anesthesia Transfer of Care Note  Patient: Stephen Compton  Procedure(s) Performed: RELEASE TRIGGER FINGER/A-1 PULLEY RIGHT MIDDLE FINGER (Right Hand)  Patient Location: PACU  Anesthesia Type:General  Level of Consciousness: sedated  Airway & Oxygen Therapy: Patient Spontanous Breathing and Patient connected to nasal cannula oxygen  Post-op Assessment: Report given to RN and Post -op Vital signs reviewed and stable  Post vital signs: Reviewed and stable  Last Vitals:  Vitals Value Taken Time  BP 122/79 08/16/18 0917  Temp    Pulse 57 08/16/18 0918  Resp 13 08/16/18 0918  SpO2 93 % 08/16/18 0918  Vitals shown include unvalidated device data.  Last Pain:  Vitals:   08/16/18 0758  PainSc: 4       Patients Stated Pain Goal: 4 (96/92/49 3241)  Complications: No apparent anesthesia complications

## 2018-08-17 ENCOUNTER — Encounter (HOSPITAL_BASED_OUTPATIENT_CLINIC_OR_DEPARTMENT_OTHER): Payer: Self-pay | Admitting: Orthopedic Surgery

## 2018-08-17 NOTE — Anesthesia Postprocedure Evaluation (Signed)
Anesthesia Post Note  Patient: Stephen Compton  Procedure(s) Performed: RELEASE TRIGGER FINGER/A-1 PULLEY RIGHT MIDDLE FINGER (Right Hand)     Patient location during evaluation: PACU Anesthesia Type: General Level of consciousness: awake and alert Pain management: pain level controlled Vital Signs Assessment: post-procedure vital signs reviewed and stable Respiratory status: spontaneous breathing, nonlabored ventilation, respiratory function stable and patient connected to nasal cannula oxygen Cardiovascular status: blood pressure returned to baseline and stable Postop Assessment: no apparent nausea or vomiting Anesthetic complications: no    Last Vitals:  Vitals:   08/16/18 1015 08/16/18 1038  BP:  134/87  Pulse: 66 65  Resp: 19 18  Temp:  36.9 C  SpO2: 99% 99%    Last Pain:  Vitals:   08/17/18 1013  PainSc: 0-No pain                 Daelan Gatt

## 2020-03-11 ENCOUNTER — Telehealth: Payer: Self-pay | Admitting: Family Medicine

## 2020-03-11 NOTE — Telephone Encounter (Signed)
03/11/2020 - I RECEIVED A CALL FROM TIWANDA FROM [facility] REGARDING THIS PATIENT. HE HAS AN APPOINTMENT WITH DR. Carlota Raspberry ON 03/18/2020. SHE IS CALLING TO SAY SHE IS MAILING HIS LAB RESULTS TODAY DUE TO THEIR FAX MACHINE BEING DOWN. SHE WAS TOLD TO MAKE THEM TO THE ATTENTION OF GINA. BEST PHONE IF QUESTIONS) [place] [phone]  [place]

## 2020-03-18 ENCOUNTER — Encounter: Payer: Self-pay | Admitting: Family Medicine

## 2020-03-18 ENCOUNTER — Ambulatory Visit: Payer: 59 | Admitting: Family Medicine

## 2020-03-18 ENCOUNTER — Other Ambulatory Visit: Payer: Self-pay

## 2020-03-18 VITALS — BP 131/84 | HR 110 | Temp 98.7°F | Ht 74.0 in | Wt 284.0 lb

## 2020-03-18 DIAGNOSIS — M67432 Ganglion, left wrist: Secondary | ICD-10-CM | POA: Diagnosis not present

## 2020-03-18 DIAGNOSIS — R7989 Other specified abnormal findings of blood chemistry: Secondary | ICD-10-CM

## 2020-03-18 DIAGNOSIS — E785 Hyperlipidemia, unspecified: Secondary | ICD-10-CM

## 2020-03-18 NOTE — Patient Instructions (Addendum)
  Try to minimize sodas, watch portion sizes and take out, and try to have breakfast each day to help with weight management.   Vitamin D supplement 1000-2000 units per day. Can recheck levels next few months.   I will recheck testosterone and cholesterol on labs - lab only visit between 8 and 10 am.   I suspect you have a cyst at the base of your thumb. I would recommend hand specialist to possibly draw out fluid based on location. Let me know if you would like to see hand specialist.   Return to the clinic or go to the nearest emergency room if any of your symptoms worsen or new symptoms occur.   If you have lab work done today you will be contacted with your lab results within the next 2 weeks.  If you have not heard from [place] then please contact us. The fastest way to get your results is to register for My Chart.   IF you received an x-ray today, you will receive an invoice from [facility]. Please contact [facility] at [phone] with questions or concerns regarding your invoice.   IF you received labwork today, you will receive an invoice from [facility]. Please contact [facility] at [phone] with questions or concerns regarding your invoice.   Our billing staff will not be able to assist you with questions regarding bills from these companies.  You will be contacted with the lab results as soon as they are available. The fastest way to get your results is to activate your My Chart account. Instructions are located on the last page of this paperwork. If you have not heard from [place] regarding the results in 2 weeks, please contact this office.

## 2020-03-18 NOTE — Progress Notes (Signed)
Subjective:  Patient ID: Stephen Compton, male    DOB: 09/10/1980  Age: 40 y.o. MRN: [id number]  CC:  Chief Complaint  Patient presents with  . Follow-up    On lab work from a Dr. Fredderick Severance office. Pt reports his testosterone, vitamin D, and good cholesterol was low on these labs. Pt reports his bad cholesterol was high as well ing these labs. Pt want to discuss this with the provider.    HPI Stephen Compton presents for  Lab consult.  followed by [name] at [facility] - for med mgt. Had routine monitoring labs on 11/20/19 - CBC ok, CMP normal. TSH normal.  Chol elevated: total 206, trig 195, HDL 35, LDL 136.  No early FH of CAD.  Had some weight gain over the years. 30# in past 2.78yrs.  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   Fast food 1-2 days per week, 1 soda per day, alcohol less than 1 per month. Breakfast sometimes. Lunch is largest.    testosterone 185 on 11/14/19 - morning draw. Last reading here 331 in 2016. On supplementation about 10 yrs ago - not recent.  Decreased libido at times, fatigue. No erectile dysfunction. No supplements.   Vitamin D borderline low at 29 on 11/20/19.   L hand bump Past 3-4 months. Nki. Base of thumb. Sore into thumb at times, no weakness. No change in activity. Sore in area at times.  R hand dominant.   History Patient Active Problem List   Diagnosis Date Noted  . Fatigue 09/20/2014  . Bipolar disorder ([place]) 09/15/2012  . Libido, decreased 09/15/2012  . Anxiety associated with depression 02/20/2010  . Snuff user 02/20/2010  . ASTHMA 02/20/2010   Past Medical History:  Diagnosis Date  . Anxiety and depression   . Asthma    Past Surgical History:  Procedure Laterality Date  . ROTATOR CUFF REPAIR Right 2017  . TRIGGER FINGER RELEASE Right 08/16/2018   Procedure: RELEASE TRIGGER FINGER/A-1 PULLEY RIGHT MIDDLE FINGER;  Surgeon: Daryll Brod, MD;  Location: [facility];  Service: Orthopedics;  Laterality: Right;  FAB  . WISDOM TOOTH EXTRACTION     Allergies  Allergen Reactions  . Wellbutrin [Bupropion]     Experienced suicidal ideation'; complete resolution off medication  . Latuda [Lurasidone Hcl] Other (See Comments)    Suicidal thoughts   Prior to Admission medications   Medication Sig Start Date End Date Taking? Authorizing Provider  lamoTRIgine (LAMICTAL) 200 MG tablet Take 200 mg by mouth daily. 02/18/20  Yes [provider]  PARoxetine (PAXIL) 20 MG tablet Take 1 tablet (20 mg total) by mouth daily. 01/15/15  Yes Timmothy Euler, MD  ARIPiprazole (ABILIFY) 10 MG tablet Take 10 mg by mouth daily. Patient not taking: Reported on 03/18/2020    [provider]  clonazePAM (KLONOPIN) 1 MG tablet TAKE 1 OR 1&1/2 TABLETS BY MOUTH EVERY DAY AS NEEDED FOR ANXIETY Patient not taking: No sig reported 08/29/12   Hendricks Limes, MD  traMADol (ULTRAM) 50 MG tablet Take 1 tablet (50 mg total) by mouth every 6 (six) hours as needed. Patient not taking: No sig reported 08/16/18   Daryll Brod, MD   Social History   Socioeconomic History  . Marital status: Legally Separated    Spouse name: Not on file  . Number of children: Not on file  . Years of education: Not on file  .  Highest education level: Not on file  Occupational History  . Not on file  Tobacco Use  . Smoking status: Former Smoker    Quit date: 02/02/2002    Years since quitting: 18.1  . Smokeless tobacco: Current User    Types: Snuff  . Tobacco comment: 1ppd ages [age]; 1 can tobacco every 3 days  Vaping Use  . Vaping Use: Former  Substance and Sexual Activity  . Alcohol use: Yes    Alcohol/week: 1.0 standard drink    Types: 1 Cans of beer per week    Comment:  intermittent  drinker  . Drug use: No  . Sexual activity: Never  Other Topics Concern  . Not on file  Social History Narrative  . Not on file   Social Determinants of Health   Financial  Resource Strain: Not on file  Food Insecurity: Not on file  Transportation Needs: Not on file  Physical Activity: Not on file  Stress: Not on file  Social Connections: Not on file  Intimate Partner Violence: Not on file    Review of Systems   Objective:   Vitals:   03/18/20 1119  BP: 131/84  Pulse: (!) 110  Temp: 98.7 F (37.1 C)  TempSrc: Temporal  SpO2: 99%  Weight: 284 lb (128.8 kg)  Height: 6\' 2"  (1.88 m)     Physical Exam Vitals reviewed.  Constitutional:      Appearance: He is well-developed and well-nourished.     Comments: overweight  HENT:     Head: Normocephalic and atraumatic.  Eyes:     Extraocular Movements: EOM normal.     Pupils: Pupils are equal, round, and reactive to light.  Neck:     Vascular: No carotid bruit or JVD.  Cardiovascular:     Rate and Rhythm: Normal rate and regular rhythm.     Heart sounds: Normal heart sounds. No murmur heard.   Pulmonary:     Effort: Pulmonary effort is normal.     Breath sounds: Normal breath sounds. No rales.  Musculoskeletal:        General: No edema.     Comments: L wrist - small cystic area at base of thumb, just proximal to [facility].   Skin:    General: Skin is warm and dry.  Neurological:     Mental Status: He is alert and oriented to person, place, and time.  Psychiatric:        Mood and Affect: Mood and affect normal.     Assessment & Plan:  ZETHAN ALFIERI is a 40 y.o. male . Low testosterone - Plan: Testosterone  -With low readings in the past.  Repeat between 8 and 10 in the morning.  Consistent supplementation.  Ganglion of left wrist  -Suspected ganglion cyst, but based on location would recommend hand specialist evaluation for possible aspiration.  Hyperlipidemia, unspecified hyperlipidemia type - Plan: Lipid panel  -Slight hyperlipidemia with low HDL.  Hypertriglyceridemia.  Weight loss discussed with exercise, lifestyle modification, will repeat labs with upcoming testosterone levels  then likely recheck in 6 months.  Hold on meds for now.  Low serum vitamin D  -1000 to 2000 units over-the-counter daily with recheck levels in the next 6 months.  No orders of the defined types were placed in this encounter.  Patient Instructions       If you have lab work done today you will be contacted with your lab results within the next 2 weeks.  If you have not  heard from [place] then please contact us. The fastest way to get your results is to register for My Chart.   IF you received an x-ray today, you will receive an invoice from [facility]. Please contact [facility] at [phone] with questions or concerns regarding your invoice.   IF you received labwork today, you will receive an invoice from [facility]. Please contact [facility] at [phone] with questions or concerns regarding your invoice.   Our billing staff will not be able to assist you with questions regarding bills from these companies.  You will be contacted with the lab results as soon as they are available. The fastest way to get your results is to activate your My Chart account. Instructions are located on the last page of this paperwork. If you have not heard from [place] regarding the results in 2 weeks, please contact this office.         Signed, [name], MD [facility]

## 2020-03-19 ENCOUNTER — Ambulatory Visit: Payer: 59

## 2020-03-19 DIAGNOSIS — R7989 Other specified abnormal findings of blood chemistry: Secondary | ICD-10-CM

## 2020-03-19 DIAGNOSIS — E785 Hyperlipidemia, unspecified: Secondary | ICD-10-CM

## 2020-03-20 LAB — LIPID PANEL
Chol/HDL Ratio: 6 ratio — ABNORMAL HIGH (ref 0.0–5.0)
Cholesterol, Total: 210 mg/dL — ABNORMAL HIGH (ref 100–199)
HDL: 35 mg/dL — ABNORMAL LOW (ref >39)
LDL Chol Calc (NIH): 148 mg/dL — ABNORMAL HIGH (ref 0–99)
Triglycerides: 150 mg/dL — ABNORMAL HIGH (ref 0–149)
VLDL Cholesterol Cal: 27 mg/dL (ref 5–40)

## 2020-03-20 LAB — TESTOSTERONE: Testosterone: 236 ng/dL — ABNORMAL LOW (ref 264–916)

## 2020-03-22 ENCOUNTER — Telehealth: Payer: Self-pay

## 2020-03-22 DIAGNOSIS — M67432 Ganglion, left wrist: Secondary | ICD-10-CM

## 2020-03-22 NOTE — Telephone Encounter (Signed)
Pt. Called requesting referral be placed to remove a cyst on his left thumb per his previous visit

## 2020-03-25 NOTE — Telephone Encounter (Signed)
Pt wants a referral to have a cyst removed. I know we saw him recently please advise.

## 2020-04-07 ENCOUNTER — Encounter: Payer: Self-pay | Admitting: Family Medicine

## 2020-04-15 ENCOUNTER — Ambulatory Visit: Payer: 59 | Admitting: Orthopedic Surgery

## 2020-06-06 ENCOUNTER — Ambulatory Visit: Payer: 59 | Admitting: Family Medicine

## 2020-06-06 ENCOUNTER — Other Ambulatory Visit: Payer: Self-pay

## 2020-06-06 VITALS — BP 124/78 | HR 95 | Temp 98.2°F | Resp 16 | Ht 74.0 in | Wt 282.0 lb

## 2020-06-06 DIAGNOSIS — R7989 Other specified abnormal findings of blood chemistry: Secondary | ICD-10-CM | POA: Diagnosis not present

## 2020-06-06 DIAGNOSIS — E785 Hyperlipidemia, unspecified: Secondary | ICD-10-CM | POA: Diagnosis not present

## 2020-06-06 NOTE — Patient Instructions (Addendum)
I will recheck testosterone level with morning lab visit (between 8 am and 10am). If low again, can refer you to endocrinology to discuss other testing or replacement options. Make sure to let them know that you did use testosterone when younger.   recheck cholesterol in the next 3-6 months. Try to incorporate exercise, even a few minutes at various times during the day.   Repeat vitamin D test with labs, let me know the dose you take.

## 2020-06-06 NOTE — Progress Notes (Signed)
Subjective:  Patient ID: Stephen Compton, male    DOB: 04/04/1980  Age: 40 y.o. MRN: [id number]  CC:  Chief Complaint  Patient presents with  . Labwork    Pt here to go over recent lab work, no concerns today no questions.     HPI Stephen Compton presents for   Hyperlipidemia: Has improved diet since February.  No new exercise - time constraints.   Wt Readings from Last 3 Encounters:  06/06/20 282 lb (127.9 kg)  03/18/20 284 lb (128.8 kg)  08/16/18 270 lb 15.1 oz (122.9 kg)    Lab Results  Component Value Date   CHOL 210 (H) 03/19/2020   HDL 35 (L) 03/19/2020   LDLCALC 148 (H) 03/19/2020   TRIG 150 (H) 03/19/2020   CHOLHDL 6.0 (H) 03/19/2020   Lab Results  Component Value Date   ALT 93 (H) 09/20/2014   AST 62 (H) 09/20/2014   ALKPHOS 49 09/20/2014   BILITOT 0.6 09/20/2014    Hypogonadism/low testosterone: See last visit on February 14.  Followed by other provider, have lab work obtained at that office.  Testosterone level 185 on 11/14/2019.  Repeat testing here also low February 15.  That level was drawn at 9:05 AM.  Feels like decreased energy. No recent supplementation. Did take some on his own when younger - testosterone - last used around age 10.  No current supplements.  No difficulty with erectile dysfunction.   Lab Results  Component Value Date   TESTOSTERONE 236 (L) 03/19/2020   Low vitamin D: Borderline low on 11/20/2019 at 29.  Taking otc vitamin D supplement - unknown dose daily.   History Patient Active Problem List   Diagnosis Date Noted  . Fatigue 09/20/2014  . Bipolar disorder ([place]) 09/15/2012  . Libido, decreased 09/15/2012  . Anxiety associated with depression 02/20/2010  . Snuff user 02/20/2010  . ASTHMA 02/20/2010   Past Medical History:  Diagnosis Date  . Anxiety and depression   . Asthma    Past Surgical History:  Procedure Laterality Date  . ROTATOR CUFF REPAIR Right 2017  . TRIGGER FINGER RELEASE Right 08/16/2018    Procedure: RELEASE TRIGGER FINGER/A-1 PULLEY RIGHT MIDDLE FINGER;  Surgeon: Daryll Brod, MD;  Location: [facility];  Service: Orthopedics;  Laterality: Right;  FAB  . WISDOM TOOTH EXTRACTION     Allergies  Allergen Reactions  . Wellbutrin [Bupropion]     Experienced suicidal ideation'; complete resolution off medication  . Latuda [Lurasidone Hcl] Other (See Comments)    Suicidal thoughts   Prior to Admission medications   Medication Sig Start Date End Date Taking? Authorizing Provider  hydrOXYzine (ATARAX/VISTARIL) 10 MG tablet Take 10-20 mg by mouth daily as needed. 01/09/20  Yes [provider]  lamoTRIgine (LAMICTAL) 200 MG tablet Take 200 mg by mouth daily. 02/18/20  Yes [provider]  PARoxetine (PAXIL) 20 MG tablet Take 1 tablet (20 mg total) by mouth daily. 01/15/15  Yes Timmothy Euler, MD   Social History   Socioeconomic History  . Marital status: Legally Separated    Spouse name: Not on file  . Number of children: Not on file  . Years of education: Not on file  . Highest education level: Not on file  Occupational History  . Not on file  Tobacco Use  . Smoking status: Former Smoker    Quit date: 02/02/2002    Years since quitting: 18.3  . Smokeless tobacco: Current User  Types: Snuff  . Tobacco comment: 1ppd ages [age]; 1 can tobacco every 3 days  Vaping Use  . Vaping Use: Former  Substance and Sexual Activity  . Alcohol use: Yes    Alcohol/week: 1.0 standard drink    Types: 1 Cans of beer per week    Comment:  intermittent  drinker  . Drug use: No  . Sexual activity: Never  Other Topics Concern  . Not on file  Social History Narrative  . Not on file   Social Determinants of Health   Financial Resource Strain: Not on file  Food Insecurity: Not on file  Transportation Needs: Not on file  Physical Activity: Not on file  Stress: Not on file  Social Connections: Not on file  Intimate Partner Violence: Not on file     Review of Systems  Per hpi  Objective:   Vitals:   06/06/20 1456  BP: 124/78  Pulse: 95  Resp: 16  Temp: 98.2 F (36.8 C)  TempSrc: Temporal  SpO2: 96%  Weight: 282 lb (127.9 kg)  Height: 6\' 2"  (1.88 m)     Physical Exam Constitutional:      General: He is not in acute distress.    Appearance: He is well-developed.  HENT:     Head: Normocephalic and atraumatic.  Cardiovascular:     Rate and Rhythm: Normal rate.  Pulmonary:     Effort: Pulmonary effort is normal.  Neurological:     Mental Status: He is alert and oriented to person, place, and time.        Assessment & Plan:  Stephen Compton is a 40 y.o. male . Low testosterone - Plan: Testosterone, CANCELED: Testosterone  -Check a.m. testosterone levels to verify previously reading.  If persistent low reading, would recommend endocrinology eval given previous use of testosterone when he was younger, and to decide on further testing, treatment options.  Hyperlipidemia, unspecified hyperlipidemia type  -Watch diet, incorporation of exercise throughout his day discussed to help with weight management, recheck cholesterol in the next 3 to 6 months.  Low serum vitamin D - Plan: Vitamin D (25 hydroxy), CANCELED: Vitamin D (25 hydroxy)  -Slightly low on previous labs.  Check level with upcoming labs, and can verify his home dosing.  No orders of the defined types were placed in this encounter.  Patient Instructions  I will recheck testosterone level with morning lab visit (between 8 am and 10am). If low again, can refer you to endocrinology to discuss other testing or replacement options. Make sure to let them know that you did use testosterone when younger.   recheck cholesterol in the next 3-6 months. Try to incorporate exercise, even a few minutes at various times during the day.   Repeat vitamin D test with labs, let me know the dose you take.      Signed, [name], MD [facility]

## 2020-06-07 ENCOUNTER — Encounter: Payer: Self-pay | Admitting: Family Medicine

## 2020-06-10 ENCOUNTER — Other Ambulatory Visit (INDEPENDENT_AMBULATORY_CARE_PROVIDER_SITE_OTHER): Payer: 59

## 2020-06-10 ENCOUNTER — Other Ambulatory Visit: Payer: Self-pay

## 2020-06-10 DIAGNOSIS — R7989 Other specified abnormal findings of blood chemistry: Secondary | ICD-10-CM | POA: Diagnosis not present

## 2020-06-10 LAB — TESTOSTERONE: Testosterone: 255.65 ng/dL — ABNORMAL LOW (ref 300.00–890.00)

## 2020-06-10 LAB — VITAMIN D 25 HYDROXY (VIT D DEFICIENCY, FRACTURES): VITD: 27.77 ng/mL — ABNORMAL LOW (ref 30.00–100.00)

## 2020-06-18 ENCOUNTER — Other Ambulatory Visit: Payer: Self-pay | Admitting: Family Medicine

## 2020-06-18 DIAGNOSIS — R7989 Other specified abnormal findings of blood chemistry: Secondary | ICD-10-CM

## 2020-09-16 ENCOUNTER — Ambulatory Visit: Payer: Self-pay | Admitting: Family Medicine

## 2020-09-26 ENCOUNTER — Ambulatory Visit (INDEPENDENT_AMBULATORY_CARE_PROVIDER_SITE_OTHER): Payer: 59 | Admitting: Endocrinology

## 2020-09-26 ENCOUNTER — Other Ambulatory Visit: Payer: Self-pay

## 2020-09-26 DIAGNOSIS — R7989 Other specified abnormal findings of blood chemistry: Secondary | ICD-10-CM | POA: Diagnosis not present

## 2020-09-26 LAB — CBC WITH DIFFERENTIAL/PLATELET
Basophils Absolute: 0 10*3/uL (ref 0.0–0.1)
Basophils Relative: 0.4 % (ref 0.0–3.0)
Eosinophils Absolute: 0.1 10*3/uL (ref 0.0–0.7)
Eosinophils Relative: 1.9 % (ref 0.0–5.0)
HCT: 46.8 % (ref 39.0–52.0)
Hemoglobin: 16 g/dL (ref 13.0–17.0)
Lymphocytes Relative: 44.6 % (ref 12.0–46.0)
Lymphs Abs: 2.1 10*3/uL (ref 0.7–4.0)
MCHC: 34.1 g/dL (ref 30.0–36.0)
MCV: 87.1 fl (ref 78.0–100.0)
Monocytes Absolute: 0.4 10*3/uL (ref 0.1–1.0)
Monocytes Relative: 8.7 % (ref 3.0–12.0)
Neutro Abs: 2.1 10*3/uL (ref 1.4–7.7)
Neutrophils Relative %: 44.4 % (ref 43.0–77.0)
Platelets: 252 10*3/uL (ref 150.0–400.0)
RBC: 5.37 Mil/uL (ref 4.22–5.81)
RDW: 13.2 % (ref 11.5–15.5)
WBC: 4.8 10*3/uL (ref 4.0–10.5)

## 2020-09-26 LAB — LUTEINIZING HORMONE: LH: 2.4 m[IU]/mL (ref 1.50–9.30)

## 2020-09-26 LAB — IBC PANEL
Iron: 40 ug/dL — ABNORMAL LOW (ref 42–165)
Saturation Ratios: 12.5 % — ABNORMAL LOW (ref 20.0–50.0)
TIBC: 320.6 ug/dL (ref 250.0–450.0)
Transferrin: 229 mg/dL (ref 212.0–360.0)

## 2020-09-26 LAB — TSH: TSH: 2.45 u[IU]/mL (ref 0.35–5.50)

## 2020-09-26 LAB — FOLLICLE STIMULATING HORMONE: FSH: 4.4 m[IU]/mL (ref 1.4–18.1)

## 2020-09-26 LAB — T4, FREE: Free T4: 1 ng/dL (ref 0.60–1.60)

## 2020-09-26 NOTE — Progress Notes (Signed)
Subjective:    Patient ID: Stephen Compton, male    DOB: 1980/10/10, 40 y.o.   MRN: [id number]  HPI Pt is referred by Dr Carlota Raspberry, for low testosterone level.  Pt reports he had puberty at the normal age.  He has 1 biological child.  He has never had pituitary imaging. He has never been on any prescribed medication for hypogonadism.  He does not take antiandrogens or opioids.  He denies any h/o infertility, XRT, or genital infection.  He has never had surgery, or a serious injury to the head or genital area.  He has no h/o sleep apnea or DVT.   He does not consume alcohol excessively.  He reports fatigue and weight gain.  Depression is well-controlled Past Medical History:  Diagnosis Date   Anxiety and depression    Asthma     Past Surgical History:  Procedure Laterality Date   ROTATOR CUFF REPAIR Right 2017   TRIGGER FINGER RELEASE Right 08/16/2018   Procedure: RELEASE TRIGGER FINGER/A-1 PULLEY RIGHT MIDDLE FINGER;  Surgeon: Daryll Brod, MD;  Location: [place];  Service: Orthopedics;  Laterality: Right;  FAB   WISDOM TOOTH EXTRACTION      Social History   Socioeconomic History   Marital status: Married    Spouse name: Not on file   Number of children: Not on file   Years of education: Not on file   Highest education level: Not on file  Occupational History   Not on file  Tobacco Use   Smoking status: Former   Smokeless tobacco: Current    Types: Snuff   Tobacco comments:    1ppd ages [age]; 1 can tobacco every 3 days  Vaping Use   Vaping Use: Former  Substance and Sexual Activity   Alcohol use: Yes    Alcohol/week: 1.0 standard drink    Types: 1 Cans of beer per week    Comment:  intermittent  drinker   Drug use: No   Sexual activity: Never  Other Topics Concern   Not on file  Social History Narrative   Not on file   Social Determinants of Health   Financial Resource Strain: Not on file  Food Insecurity: Not on file  Transportation Needs: Not on  file  Physical Activity: Not on file  Stress: Not on file  Social Connections: Not on file  Intimate Partner Violence: Not on file    Current Outpatient Medications on File Prior to Visit  Medication Sig Dispense Refill   hydrOXYzine (ATARAX/VISTARIL) 10 MG tablet Take 10-20 mg by mouth daily as needed.     lamoTRIgine (LAMICTAL) 200 MG tablet Take 200 mg by mouth daily.     PARoxetine (PAXIL) 20 MG tablet Take 1 tablet (20 mg total) by mouth daily. (Patient not taking: Reported on 09/26/2020) 30 tablet 0   No current facility-administered medications on file prior to visit.    Allergies  Allergen Reactions   Wellbutrin [Bupropion]     Experienced suicidal ideation'; complete resolution off medication   Latuda [Lurasidone Hcl] Other (See Comments)    Suicidal thoughts    Family History  Problem Relation Age of Onset   Thyroid disease Mother    Depression Father    Alcohol abuse Father    Hypertension Brother    Breast cancer Maternal Grandmother    Cancer Maternal Grandfather        ? primary   Cancer Paternal Grandmother        ?  primary   Alcohol abuse Paternal Grandmother    Cancer Paternal Grandfather        ? primary   Alcohol abuse Paternal Grandfather    Depression Sister    Depression Paternal Aunt        two   Alcohol abuse Paternal Aunt    Depression Paternal Uncle    Bipolar disorder Other        no FH but most likely present(see above)   Heart disease Neg Hx    Stroke Neg Hx     BP 132/84 (BP Location: Right Arm, Patient Position: Sitting, Cuff Size: Large)   Pulse 94   Ht '6\' 2"'$  (1.88 m)   Wt 272 lb 6.4 oz (123.6 kg)   SpO2 95%   BMI 34.97 kg/m     Review of Systems denies erectile dysfunction, muscle weakness, headache, and sob.      Objective:   Physical Exam VS: see vs page GEN: no distress HEAD: head: no deformity eyes: no periorbital swelling, no proptosis external nose and ears are normal NECK: supple, thyroid is not  enlarged CHEST WALL: no deformity LUNGS: clear to auscultation BREASTS:  No gynecomastia CV: reg rate and rhythm, no murmur GENITALIA:  Normal male.   MUSCULOSKELETAL: muscle bulk and strength are grossly normal.  no joint swelling is seen  gait is normal and steady.   EXTEMITIES: no leg edema NEURO: sensation is intact to touch on all 4's SKIN:  Normal texture and temperature.  No rash or suspicious lesion is visible.  Normal male hair distribution. NODES:  None palpable at the neck PSYCH: alert, well-oriented.  Does not appear anxious nor depressed.    Lab Results  Component Value Date   TSH 2.080 09/20/2014   T4TOTAL 5.8 09/20/2014   Lab Results  Component Value Date   TESTOSTERONE 255.65 (L) 06/10/2020   I have reviewed outside records, and summarized: Pt was noted to have low testosterone, and referred here.  Pt reported taking illicit androgens, [id number] years ago.     Assessment & Plan:  Low testosterone, new to me, uncertain etiology and prognosis.    Patient Instructions  Blood tests are requested for you today.  We'll let you know about the results.  Based on the results, I hope to be able to prescribe for you a pill for to increase the testosterone.   Testosterone treatment has risks, including increased or decreased fertility (depending on the type of treatment), hair loss, prostate cancer, benign prostate enlargement, blood clots, liver problems, lower hdl ("good cholesterol"), polycythemia (opposite of anemia), sleep apnea, and behavior changes.   Weight loss also helps the testosterone.

## 2020-09-26 NOTE — Patient Instructions (Addendum)
Blood tests are requested for you today.  We'll let you know about the results.  Based on the results, I hope to be able to prescribe for you a pill for to increase the testosterone.   Testosterone treatment has risks, including increased or decreased fertility (depending on the type of treatment), hair loss, prostate cancer, benign prostate enlargement, blood clots, liver problems, lower hdl ("good cholesterol"), polycythemia (opposite of anemia), sleep apnea, and behavior changes.   Weight loss also helps the testosterone.

## 2020-09-27 LAB — PROLACTIN: Prolactin: <1 ng/mL — ABNORMAL LOW (ref 2.0–18.0)

## 2020-10-02 ENCOUNTER — Other Ambulatory Visit: Payer: Self-pay | Admitting: Endocrinology

## 2020-10-02 DIAGNOSIS — R7989 Other specified abnormal findings of blood chemistry: Secondary | ICD-10-CM

## 2020-10-02 LAB — TESTOSTERONE,FREE AND TOTAL
Testosterone, Free: 5.8 pg/mL — ABNORMAL LOW (ref 8.7–25.1)
Testosterone: 158 ng/dL — ABNORMAL LOW (ref 264–916)

## 2020-10-02 MED ORDER — CLOMIPHENE CITRATE 50 MG PO TABS
25.0000 mg | ORAL_TABLET | ORAL | 3 refills | Status: DC
Start: 2020-10-02 — End: 2021-12-29

## 2020-12-04 ENCOUNTER — Ambulatory Visit: Payer: 59 | Admitting: Endocrinology

## 2020-12-11 ENCOUNTER — Ambulatory Visit: Payer: 59 | Admitting: Family Medicine

## 2021-12-18 ENCOUNTER — Telehealth: Payer: Self-pay | Admitting: Family Medicine

## 2021-12-18 NOTE — Telephone Encounter (Signed)
Received message from [facility] regarding difficulty filling his medication.  Apparently he has been out of his Lamictal and Paxil.  Please clarify who w filling this medication, and is he overdue for a visit or other reason for difficulty in filling that medication?  Unfortunate I have not seen him since May of last year. Can potentially help him out temporarily with medication but I will need to have a visit to discuss his meds if that is needed - virtual is ok if needed. Thanks.

## 2021-12-19 NOTE — Telephone Encounter (Signed)
I spoke to the pt and he states that [facility] he states she does telehealth visit and he can not get her to refill his medication he is asking if DR Carlota Raspberry will refill them . I advised him he will need an apt .

## 2021-12-19 NOTE — Telephone Encounter (Signed)
I am happy to try to help temporarily.  Looks like he has a visit with me scheduled in 10 days.

## 2021-12-29 ENCOUNTER — Ambulatory Visit: Payer: 59 | Admitting: Family Medicine

## 2021-12-29 ENCOUNTER — Encounter: Payer: Self-pay | Admitting: Family Medicine

## 2021-12-29 VITALS — BP 122/74 | HR 91 | Temp 99.1°F | Ht 74.0 in | Wt 281.6 lb

## 2021-12-29 DIAGNOSIS — F319 Bipolar disorder, unspecified: Secondary | ICD-10-CM | POA: Diagnosis not present

## 2021-12-29 NOTE — Patient Instructions (Signed)
I have placed a new psychiatry referral. If meds are running out prior to that appointment let me know and I can refill meds temporarily.   Follow up with urology regarding clomid and testosterone questions.

## 2021-12-29 NOTE — Progress Notes (Unsigned)
Subjective:  Patient ID: Stephen Compton, male    DOB: 1980-06-26  Age: 41 y.o. MRN: [id number]  CC:  Chief Complaint  Patient presents with   Medication Refill    Pt states he has someone that fills his meds but he has no refills and they are slow to refill meds and wants to know if you can refer him to someone that can refill them and if you can refill them temporarily    Depression    PHQ9 - 9    HPI Stephen Compton presents for   Bipolar disorder: See my chart message/telephone notes.  He is followed by psychiatry - [name], [facility] for many years. Considering moving to other provider. Has had monitoring labs with psychiatry.  Has had some difficulty with obtaining medication. Has been seeing her every 3-4 months. Running out of meds early? Not enough refills - not sure.  Not taking hydroxyzine -has some at home.  Paxil '20mg'$  1 and 1/2 per day. This has run out early - out for a week about a month ago - back on now.  Lamictal '200mg'$  QD - out for about a week and a half, restarted 6 days ago.  Abilify '5mg'$  once per day - has refills.  No SI/HI. No self harm. Some increased alcohol off meds, 1-2, 3-4 days per week.  No IDU.    Off clomid - urology, did not notice much change. Plans to follow up with urology.        History Patient Active Problem List   Diagnosis Date Noted   Low testosterone 09/26/2020   Fatigue 09/20/2014   Bipolar disorder ([place]) 09/15/2012   Libido, decreased 09/15/2012   Anxiety associated with depression 02/20/2010   Snuff user 02/20/2010   ASTHMA 02/20/2010   Past Medical History:  Diagnosis Date   Anxiety and depression    Asthma    Past Surgical History:  Procedure Laterality Date   ROTATOR CUFF REPAIR Right 2017   TRIGGER FINGER RELEASE Right 08/16/2018   Procedure: RELEASE TRIGGER FINGER/A-1 PULLEY RIGHT MIDDLE FINGER;  Surgeon: Daryll Brod, MD;  Location: [place];  Service: Orthopedics;   Laterality: Right;  FAB   WISDOM TOOTH EXTRACTION     Allergies  Allergen Reactions   Bupropion Other (See Comments)    Experienced suicidal ideation'; complete resolution off medication  Makes depression worse   Latuda [Lurasidone Hcl] Other (See Comments)    Suicidal thoughts   Prior to Admission medications   Medication Sig Start Date End Date Taking? Authorizing Provider  ARIPiprazole (ABILIFY) 5 MG tablet Take 5 mg by mouth daily.   Yes [provider]  clomiPHENE (CLOMID) 50 MG tablet Take 0.5 tablets (25 mg total) by mouth 3 (three) times a week. Patient not taking: Reported on 12/29/2021 10/02/20   Renato Shin, MD  D3-50 1.25 MG (50000 UT) capsule Take 50,000 Units by mouth once a week. 08/08/21  Yes [provider]  docusate sodium (COLACE) 100 MG capsule Take 100 mg by mouth daily. 07/28/21  Yes [provider]  lamoTRIgine (LAMICTAL) 200 MG tablet Take 200 mg by mouth daily. 02/18/20  Yes [provider]  PARoxetine (PAXIL) 20 MG tablet Take 1 tablet (20 mg total) by mouth daily. 01/15/15  Yes Timmothy Euler, MD  hydrOXYzine (ATARAX/VISTARIL) 10 MG tablet Take 10-20 mg by mouth daily as needed. Patient not taking: Reported on 12/29/2021 01/09/20   [provider]  lamoTRIgine  100 MG TBDP Take by mouth.    [provider]  PARoxetine (PAXIL) 10 MG tablet Take by mouth.    [provider]   Social History   Socioeconomic History   Marital status: Married    Spouse name: Not on file   Number of children: Not on file   Years of education: Not on file   Highest education level: Not on file  Occupational History   Not on file  Tobacco Use   Smoking status: Former   Smokeless tobacco: Current    Types: Snuff   Tobacco comments:    1ppd ages [age]; 1 can tobacco every 3 days  Vaping Use   Vaping Use: Former  Substance and Sexual Activity   Alcohol use: Yes    Alcohol/week: 1.0 standard drink of alcohol     Types: 1 Cans of beer per week    Comment:  intermittent  drinker   Drug use: No   Sexual activity: Never  Other Topics Concern   Not on file  Social History Narrative   Not on file   Social Determinants of Health   Financial Resource Strain: Not on file  Food Insecurity: Not on file  Transportation Needs: Not on file  Physical Activity: Not on file  Stress: Not on file  Social Connections: Not on file  Intimate Partner Violence: Not on file    Review of Systems   Objective:   Vitals:   12/29/21 1418  BP: 122/74  Pulse: 91  Temp: 99.1 F (37.3 C)  SpO2: 99%  Weight: 281 lb 9.6 oz (127.7 kg)  Height: '6\' 2"'$  (1.88 m)     Physical Exam Constitutional:      General: He is not in acute distress.    Appearance: Normal appearance. He is well-developed.  HENT:     Head: Normocephalic and atraumatic.  Cardiovascular:     Rate and Rhythm: Normal rate.  Pulmonary:     Effort: Pulmonary effort is normal.  Neurological:     Mental Status: He is alert and oriented to person, place, and time.  Psychiatric:        Mood and Affect: Mood normal.        Behavior: Behavior normal.        Thought Content: Thought content normal.        Assessment & Plan:  Stephen Compton is a 41 y.o. male . Bipolar affective disorder, remission status unspecified ([place]) - Plan: Ambulatory referral to Psychiatry   No orders of the defined types were placed in this encounter.  Patient Instructions  I have placed a new psychiatry referral. If meds are running out prior to that appointment let me know and I can refill meds temporarily.   Follow up with urology regarding clomid and testosterone questions.     Signed,   [name], MD [facility] 12/29/21 3:06 PM

## 2021-12-30 ENCOUNTER — Encounter: Payer: Self-pay | Admitting: Family Medicine

## 2022-03-19 ENCOUNTER — Ambulatory Visit: Payer: 59 | Admitting: Internal Medicine

## 2022-03-31 ENCOUNTER — Ambulatory Visit: Payer: 59 | Admitting: Internal Medicine

## 2022-03-31 ENCOUNTER — Encounter: Payer: Self-pay | Admitting: Internal Medicine

## 2022-03-31 ENCOUNTER — Ambulatory Visit (INDEPENDENT_AMBULATORY_CARE_PROVIDER_SITE_OTHER): Payer: 59 | Admitting: Internal Medicine

## 2022-03-31 VITALS — BP 124/76 | HR 94 | Ht 74.0 in | Wt 286.0 lb

## 2022-03-31 DIAGNOSIS — R7989 Other specified abnormal findings of blood chemistry: Secondary | ICD-10-CM | POA: Diagnosis not present

## 2022-03-31 MED ORDER — CLOMIPHENE CITRATE 50 MG PO TABS: 50.0000 mg | ORAL_TABLET | ORAL | 2 refills | Status: DC

## 2022-03-31 NOTE — Progress Notes (Unsigned)
Name: Stephen Compton  MRN/ DOB: [id number], 12-24-1980    Age/ Sex: 42 y.o., male     PCP: Wendie Agreste, MD   Reason for Endocrinology Evaluation: Hypogonadism     Initial Endocrinology Clinic Visit: 09/2020    PATIENT IDENTIFIER: Stephen Compton is a 75 y.o., male with a past medical history of hypogonadism, asthma, bipolar disorder. He has followed with [facility] since 09/2020 for consultative assistance with management of his hypogonadism.   HISTORICAL SUMMARY: The patient was first diagnosed hypogonadism in 2014 with a testosterone level of 233, SHBG borderline low at 19 (13-71).  At the time he was seen by Dr. Cruzita Lederer and due to normal free testosterone, no treatment was offered and the risk was deemed to outweigh the benefit.  He was again evaluated for low testosterone by Dr. Loanne Drilling in 2022  His Kline was normal as well as LH, prolactin was undetectable <1.0 NG/mL with a testosterone level of 158 NG/DL in 09/2020   He was prescribed clomiphene by Dr. Loanne Drilling 09/2020     Pt reports he had puberty at the normal age.  He has 1 biological child.  He has never had pituitary imaging.  He does not take antiandrogens or opioids.  He denies any h/o infertility, XRT, or genital infection.  He has never had surgery, or a serious injury to the head or genital area.  He has no h/o sleep apnea or DVT.   He does not consume alcohol excessively.  SUBJECTIVE:    Today (03/31/2022):  Stephen Compton is here for hypogonadism.  He was last seen in 09/2020 with decreased libido and fatigue  He was prescribed Clomiphene  but did not notice any improvement   He denies erectile dysfunction  Denies head injury  Denies headaches  Denies visual changes  No change in hair growth  No change in testicular size    Clomiphene 0.5 tabs 3 times a week  HISTORY:  Past Medical History:  Past Medical History:  Diagnosis Date   Anxiety and depression    Asthma    Past Surgical  History:  Past Surgical History:  Procedure Laterality Date   ROTATOR CUFF REPAIR Right 2017   TRIGGER FINGER RELEASE Right 08/16/2018   Procedure: RELEASE TRIGGER FINGER/A-1 PULLEY RIGHT MIDDLE FINGER;  Surgeon: Daryll Brod, MD;  Location: [place] [facility];  Service: Orthopedics;  Laterality: Right;  FAB   WISDOM TOOTH EXTRACTION     Social History:  reports that he has quit smoking. His smokeless tobacco use includes snuff. He reports current alcohol use of about 1.0 standard drink of alcohol per week. He reports that he does not use drugs. Family History:  Family History  Problem Relation Age of Onset   Thyroid disease Mother    Depression Father    Alcohol abuse Father    Hypertension Brother    Breast cancer Maternal Grandmother    Cancer Maternal Grandfather        ? primary   Cancer Paternal Grandmother        ? primary   Alcohol abuse Paternal Grandmother    Cancer Paternal Grandfather        ? primary   Alcohol abuse Paternal Grandfather    Depression Sister    Depression Paternal Aunt        two   Alcohol abuse Paternal Aunt    Depression Paternal Uncle    Bipolar disorder Other  no FH but most likely present(see above)   Heart disease Neg Hx    Stroke Neg Hx      HOME MEDICATIONS: Allergies as of 03/31/2022       Reactions   Bupropion Other (See Comments)   Experienced suicidal ideation'; complete resolution off medication Makes depression worse   Latuda [lurasidone Hcl] Other (See Comments)   Suicidal thoughts        Medication List        Accurate as of March 31, 2022  3:20 PM. If you have any questions, ask your nurse or doctor.          STOP taking these medications    D3-50 1.25 MG (50000 UT) capsule Generic drug: Cholecalciferol Stopped by: Dorita Sciara, MD   hydrOXYzine 10 MG tablet Commonly known as: ATARAX Stopped by: Dorita Sciara, MD       TAKE these medications    ARIPiprazole 5 MG  tablet Commonly known as: ABILIFY Take 5 mg by mouth daily.   lamoTRIgine 200 MG tablet Commonly known as: LAMICTAL Take 200 mg by mouth daily.   PARoxetine 20 MG tablet Commonly known as: PAXIL Take 1 tablet (20 mg total) by mouth daily. What changed: how much to take   propranolol 10 MG tablet Commonly known as: INDERAL Take 10 mg by mouth 3 (three) times daily.          OBJECTIVE:   PHYSICAL EXAM: VS: BP 124/76 (BP Location: Left Arm, Patient Position: Sitting, Cuff Size: Large)   Pulse 94   Ht '6\' 2"'$  (1.88 m)   Wt 286 lb (129.7 kg)   SpO2 99%   BMI 36.72 kg/m    EXAM: General: Pt appears well and is in NAD  Eyes: External eye exam normal without stare, lid lag or exophthalmos.  EOM intact.    Neck: General: Supple without adenopathy. Thyroid: Thyroid size normal.    Lungs: Clear with good BS bilat with no rales, rhonchi, or wheezes  Chest : Minimal gynecomastia bilaterally   Heart: Auscultation: RRR.  Abdomen: Normoactive bowel sounds, soft, nontender, without masses or organomegaly palpable  Extremities:  BL LE: No pretibial edema normal   Mental Status: Judgment, insight: Intact Orientation: Oriented to time, place, and person Mood and affect: No depression, anxiety, or agitation     DATA REVIEWED: ***    ASSESSMENT / PLAN / RECOMMENDATIONS:   ***  Plan: ***    Medications   ***   Signed electronically by: Mack Guise, MD  [facility]  [facility] [address] [place], [place] [address] Phone: [phone] FAX: [phone]      CC: Wendie Agreste, MD [address] [place] [address] Phone: [phone]  Fax: [phone]   Return to Endocrinology clinic as below: No future appointments.

## 2022-03-31 NOTE — Patient Instructions (Signed)
Take Clomiphene 1 tablet four days a week

## 2022-04-06 ENCOUNTER — Other Ambulatory Visit (INDEPENDENT_AMBULATORY_CARE_PROVIDER_SITE_OTHER): Payer: 59

## 2022-04-06 DIAGNOSIS — R7989 Other specified abnormal findings of blood chemistry: Secondary | ICD-10-CM | POA: Diagnosis not present

## 2022-04-06 LAB — CBC
HCT: 47.6 % (ref 39.0–52.0)
Hemoglobin: 16.6 g/dL (ref 13.0–17.0)
MCHC: 34.9 g/dL (ref 30.0–36.0)
MCV: 87.6 fl (ref 78.0–100.0)
Platelets: 291 10*3/uL (ref 150.0–400.0)
RBC: 5.44 Mil/uL (ref 4.22–5.81)
RDW: 13 % (ref 11.5–15.5)
WBC: 6.5 10*3/uL (ref 4.0–10.5)

## 2022-04-06 LAB — COMPREHENSIVE METABOLIC PANEL
ALT: 28 U/L (ref 0–53)
AST: 19 U/L (ref 0–37)
Albumin: 4.5 g/dL (ref 3.5–5.2)
Alkaline Phosphatase: 54 U/L (ref 39–117)
BUN: 21 mg/dL (ref 6–23)
CO2: 28 mEq/L (ref 19–32)
Calcium: 10 mg/dL (ref 8.4–10.5)
Chloride: 100 mEq/L (ref 96–112)
Creatinine, Ser: 1.16 mg/dL (ref 0.40–1.50)
GFR: 79.05 mL/min (ref >60.00)
Glucose, Bld: 90 mg/dL (ref 70–99)
Potassium: 4.3 mEq/L (ref 3.5–5.1)
Sodium: 138 mEq/L (ref 135–145)
Total Bilirubin: 0.7 mg/dL (ref 0.2–1.2)
Total Protein: 7.3 g/dL (ref 6.0–8.3)

## 2022-04-06 LAB — LUTEINIZING HORMONE: LH: 2.18 m[IU]/mL (ref 1.50–9.30)

## 2022-04-06 LAB — T4, FREE: Free T4: 0.7 ng/dL (ref 0.60–1.60)

## 2022-04-06 LAB — PSA: PSA: 0.62 ng/mL (ref 0.10–4.00)

## 2022-04-06 LAB — TSH: TSH: 3.07 u[IU]/mL (ref 0.35–5.50)

## 2022-04-09 LAB — TESTOSTERONE, TOTAL, LC/MS/MS: Testosterone, Total, LC-MS-MS: 179 ng/dL — ABNORMAL LOW (ref 250–1100)

## 2022-04-09 LAB — PROLACTIN: Prolactin: 1.2 ng/mL — ABNORMAL LOW (ref 2.0–18.0)

## 2022-05-11 ENCOUNTER — Ambulatory Visit: Payer: 59 | Admitting: Family Medicine

## 2022-05-11 ENCOUNTER — Encounter: Payer: Self-pay | Admitting: Family Medicine

## 2022-05-11 VITALS — BP 128/82 | HR 83 | Temp 98.4°F | Ht 74.0 in | Wt 271.0 lb

## 2022-05-11 DIAGNOSIS — R253 Fasciculation: Secondary | ICD-10-CM

## 2022-05-11 DIAGNOSIS — M542 Cervicalgia: Secondary | ICD-10-CM | POA: Diagnosis not present

## 2022-05-11 NOTE — Progress Notes (Unsigned)
Subjective:  Patient ID: Stephen Compton, male    DOB: 1980/12/03  Age: 42 y.o. MRN: [id number]  CC:  Chief Complaint  Patient presents with   Sore Throat    Pt states that he is having throat pain on the left side not associated with a sore throat but he says it the outer part of throat x2 weeks. Also states he had bicep surgery on left arm and now has a twitch in his thumb he is concerned about.     HPI Stephen Compton presents for   Neck pain: Left neck pain - on outside of throat. 2-2.5 weeks. Initial pain in neck with swallowing. No sore throat No fever. No neck/arm wounds.  Able to swallow liquids and solids. No bumps. Nki. No pain with movement.  About the same - slightly better.  No treatment.  Has used smokeless tobacco/dip.  No routine dentist.   Thumb twitch: Bicep tendon surgery about 6 months ago. Notices twitching of thumb past 2-3 months. Notices at rest. Thumb twitches only. No pain/weakness. Has not discussed with surgeon.   History Patient Active Problem List   Diagnosis Date Noted   Low testosterone 09/26/2020   Fatigue 09/20/2014   Bipolar disorder 09/15/2012   Libido, decreased 09/15/2012   Anxiety associated with depression 02/20/2010   Snuff user 02/20/2010   ASTHMA 02/20/2010   Past Medical History:  Diagnosis Date   Anxiety and depression    Asthma    Past Surgical History:  Procedure Laterality Date   ROTATOR CUFF REPAIR Right 2017   TRIGGER FINGER RELEASE Right 08/16/2018   Procedure: RELEASE TRIGGER FINGER/A-1 PULLEY RIGHT MIDDLE FINGER;  Surgeon: Cindee Salt, MD;  Location: [facility];  Service: Orthopedics;  Laterality: Right;  FAB   WISDOM TOOTH EXTRACTION     Allergies  Allergen Reactions   Bupropion Other (See Comments)    Experienced suicidal ideation'; complete resolution off medication  Makes depression worse   Latuda [Lurasidone Hcl] Other (See Comments)    Suicidal thoughts   Prior to Admission medications    Medication Sig Start Date End Date Taking? Authorizing Provider  ARIPiprazole (ABILIFY) 5 MG tablet Take 5 mg by mouth daily.   Yes [provider]  clomiPHENE (CLOMID) 50 MG tablet Take 1 tablet (50 mg total) by mouth as directed. Take 1 tablet four days a week 03/31/22  Yes Shamleffer, Konrad Dolores, MD  lamoTRIgine (LAMICTAL) 200 MG tablet Take 200 mg by mouth daily. 02/18/20  Yes [provider]  PARoxetine (PAXIL) 20 MG tablet Take 1 tablet (20 mg total) by mouth daily. Patient taking differently: Take 30 mg by mouth daily. 01/15/15  Yes Elenora Gamma, MD  propranolol (INDERAL) 10 MG tablet Take 10 mg by mouth 3 (three) times daily.   Yes [provider]   Social History   Socioeconomic History   Marital status: Married    Spouse name: Not on file   Number of children: Not on file   Years of education: Not on file   Highest education level: Not on file  Occupational History   Not on file  Tobacco Use   Smoking status: Former   Smokeless tobacco: Current    Types: Snuff   Tobacco comments:    1ppd ages [age]; 1 can tobacco every 3 days  Vaping Use   Vaping Use: Former  Substance and Sexual Activity   Alcohol use: Yes    Alcohol/week: 1.0 standard drink of  alcohol    Types: 1 Cans of beer per week    Comment:  intermittent  drinker   Drug use: No   Sexual activity: Never  Other Topics Concern   Not on file  Social History Narrative   Not on file   Social Determinants of Health   Financial Resource Strain: Not on file  Food Insecurity: Not on file  Transportation Needs: Not on file  Physical Activity: Not on file  Stress: Not on file  Social Connections: Not on file  Intimate Partner Violence: Not on file    Review of Systems   Objective:   Vitals:   05/11/22 1538  BP: 128/82  Pulse: 83  Temp: 98.4 F (36.9 C)  TempSrc: Temporal  SpO2: 97%  Weight: 271 lb (122.9 kg)  Height: 6\' 2"  (1.88 m)     Physical  Exam Constitutional:      General: He is not in acute distress.    Appearance: Normal appearance. He is well-developed.  HENT:     Head: Normocephalic and atraumatic.  Cardiovascular:     Rate and Rhythm: Normal rate.  Pulmonary:     Effort: Pulmonary effort is normal.  Neurological:     Mental Status: He is alert and oriented to person, place, and time.  Psychiatric:        Mood and Affect: Mood normal.        Assessment & Plan:  Stephen Compton is a 42 y.o. male . No diagnosis found.   No orders of the defined types were placed in this encounter.  There are no Patient Instructions on file for this visit.    Signed,   Meredith Staggers, MD [facility], [facility] 05/11/22 4:53 PM

## 2022-05-11 NOTE — Patient Instructions (Addendum)
Contact your orthopedist to discuss the twitching of the thumb.  I will order an ultrasound to evaluate the left neck and to look for a lymph node or concerns.  That could be a reactive lymph node from a previous infection which should improve over the next week or 2.  If any fevers or worsening symptoms, be seen right away.

## 2022-05-12 ENCOUNTER — Encounter: Payer: Self-pay | Admitting: Family Medicine

## 2022-05-18 ENCOUNTER — Ambulatory Visit (HOSPITAL_BASED_OUTPATIENT_CLINIC_OR_DEPARTMENT_OTHER)
Admission: RE | Admit: 2022-05-18 | Discharge: 2022-05-18 | Disposition: A | Discharge status: Home / Self Care | Payer: 59 | Source: Ambulatory Visit | Attending: Family Medicine | Admitting: Family Medicine

## 2022-05-18 DIAGNOSIS — M542 Cervicalgia: Secondary | ICD-10-CM

## 2022-09-15 ENCOUNTER — Ambulatory Visit: Payer: 59 | Admitting: Internal Medicine

## 2022-09-15 ENCOUNTER — Encounter: Payer: Self-pay | Admitting: Internal Medicine

## 2022-09-15 VITALS — BP 124/82 | HR 84 | Ht 74.0 in | Wt 278.0 lb

## 2022-09-15 DIAGNOSIS — E230 Hypopituitarism: Secondary | ICD-10-CM

## 2022-09-15 NOTE — Progress Notes (Signed)
Name: Stephen Compton  MRN/ DOB: [id number], [birth year]    Age/ Sex: 42 y.o., male     PCP: Shade Flood, MD   Reason for Endocrinology Evaluation: Hypogonadism     Initial Endocrinology Clinic Visit: 09/2020    PATIENT IDENTIFIER: Stephen Compton is a 57 y.o., male with a past medical history of hypogonadism, asthma, bipolar disorder. He has followed with [facility] since 09/2020 for consultative assistance with management of his hypogonadism.   HISTORICAL SUMMARY: The patient was first diagnosed hypogonadism in 2014 with a testosterone level of 233, SHBG borderline low at 19 (13-71).  At the time he was seen by Dr. Elvera Lennox and due to normal free testosterone, no treatment was offered and the risk was deemed to outweigh the benefit.  He was again evaluated for low testosterone by Dr. Everardo All in 2022  His FSH was normal as well as LH, prolactin was undetectable <1.0 NG/mL with a testosterone level of 158 NG/DL in 08/8467   He was prescribed clomiphene by Dr. Everardo All 09/2020     Pt reports he had puberty at the normal age.  He has 1 biological child.  He has never had pituitary imaging.  He does not take antiandrogens or opioids.  He denies any h/o infertility, XRT, or genital infection.  He has never had surgery, or a serious injury to the head or genital area.  He has no h/o sleep apnea or DVT.   He does not consume alcohol excessively.    Labs in March 2024 revealed a testosterone level of 179 NG/mL, low prolactin level at 1.2, inappropriately normal LH of 2.18, normal TFTs.  Restarted clomiphene    SUBJECTIVE:    Today (09/15/2022):  Stephen Compton is here for hypogonadism.  Weight has been fluctuating  Denies headaches  He denies erectile dysfunction  Has spontaneous erections  No galactorrhea  Denies headaches  Denies visual changes  No narcotic  No cannabis   Denies prior  testosterone    Clomiphene 1 tabs 4 times a week  Monday through  Thursdays      HISTORY:  Past Medical History:  Past Medical History:  Diagnosis Date   Anxiety and depression    Asthma    Past Surgical History:  Past Surgical History:  Procedure Laterality Date   ROTATOR CUFF REPAIR Right 2017   TRIGGER FINGER RELEASE Right 08/16/2018   Procedure: RELEASE TRIGGER FINGER/A-1 PULLEY RIGHT MIDDLE FINGER;  Surgeon: Cindee Salt, MD;  Location: [facility];  Service: Orthopedics;  Laterality: Right;  FAB   WISDOM TOOTH EXTRACTION     Social History:  reports that he has quit smoking. His smokeless tobacco use includes snuff. He reports current alcohol use of about 1.0 standard drink of alcohol per week. He reports that he does not use drugs. Family History:  Family History  Problem Relation Age of Onset   Thyroid disease Mother    Depression Father    Alcohol abuse Father    Hypertension Brother    Breast cancer Maternal Grandmother    Cancer Maternal Grandfather        ? primary   Cancer Paternal Grandmother        ? primary   Alcohol abuse Paternal Grandmother    Cancer Paternal Grandfather        ? primary   Alcohol abuse Paternal Grandfather    Depression Sister    Depression Paternal Aunt  two   Alcohol abuse Paternal Aunt    Depression Paternal Uncle    Bipolar disorder Other        no FH but most likely present(see above)   Heart disease Neg Hx    Stroke Neg Hx      HOME MEDICATIONS: Allergies as of 09/15/2022       Reactions   Bupropion Other (See Comments)   Experienced suicidal ideation'; complete resolution off medication Makes depression worse   Latuda [lurasidone Hcl] Other (See Comments)   Suicidal thoughts        Medication List        Accurate as of September 15, 2022  3:09 PM. If you have any questions, ask your nurse or doctor.          ARIPiprazole 5 MG tablet Commonly known as: ABILIFY Take 5 mg by mouth daily.   clomiPHENE 50 MG tablet Commonly known as: CLOMID Take 1  tablet (50 mg total) by mouth as directed. Take 1 tablet four days a week   lamoTRIgine 200 MG tablet Commonly known as: LAMICTAL Take 200 mg by mouth daily.   PARoxetine 30 MG tablet Commonly known as: PAXIL Take 30 mg by mouth daily. What changed: Another medication with the same name was removed. Continue taking this medication, and follow the directions you see here. Changed by: Johnney Ou Sheleen Conchas   propranolol 10 MG tablet Commonly known as: INDERAL Take 10 mg by mouth 3 (three) times daily.          OBJECTIVE:   PHYSICAL EXAM: VS: BP 124/82 (BP Location: Left Arm, Patient Position: Sitting, Cuff Size: Large)   Pulse 84   Ht 6\' 2"  (1.88 m)   Wt 278 lb (126.1 kg)   SpO2 98%   BMI 35.69 kg/m    EXAM: General: Pt appears well and is in NAD  Eyes: External eye exam normal without stare, lid lag or exophthalmos.   Neck: General: Supple without adenopathy. Thyroid: Thyroid size normal.    Lungs: Clear with good BS bilat   Heart: Auscultation: RRR.  Abdomen:  soft, nontender  Extremities:  BL LE: No pretibial edema normal   Mental Status: Judgment, insight: Intact Orientation: Oriented to time, place, and person Mood and affect: No depression, anxiety, or agitation     DATA REVIEWED:   Latest Reference Range & Units 04/06/22 08:00  Sodium 135 - 145 mEq/L 138  Potassium 3.5 - 5.1 mEq/L 4.3  Chloride 96 - 112 mEq/L 100  CO2 19 - 32 mEq/L 28  Glucose 70 - 99 mg/dL 90  BUN 6 - 23 mg/dL 21  Creatinine 1.61 - 0.96 mg/dL 0.45  Calcium 8.4 - 40.9 mg/dL 81.1  Alkaline Phosphatase 39 - 117 U/L 54  Albumin 3.5 - 5.2 g/dL 4.5  AST 0 - 37 U/L 19  ALT 0 - 53 U/L 28  Total Protein 6.0 - 8.3 g/dL 7.3  Total Bilirubin 0.2 - 1.2 mg/dL 0.7  GFR >91.47 mL/min 79.05    Latest Reference Range & Units 04/06/22 08:00  WBC 4.0 - 10.5 K/uL 6.5  RBC 4.22 - 5.81 Mil/uL 5.44  Hemoglobin 13.0 - 17.0 g/dL 82.9  HCT 56.2 - 13.0 % 47.6  MCV 78.0 - 100.0 fl 87.6  MCHC 30.0 -  36.0 g/dL 86.5  RDW 78.4 - 69.6 % 13.0  Platelets 150.0 - 400.0 K/uL 291.0    Latest Reference Range & Units 04/06/22 08:00  LH 1.50 - 9.30 mIU/mL 2.18  Prolactin 2.0 - 18.0 ng/mL 1.2 (L)  Glucose 70 - 99 mg/dL 90  Testosterone, Total, LC-MS-MS 250 - 1,100 ng/dL 324 (L)  TSH 4.01 - 0.27 uIU/mL 3.07  T4,Free(Direct) 0.60 - 1.60 ng/dL 2.53  (L): Data is abnormally low Old records , labs and images have been reviewed.   ASSESSMENT / PLAN / RECOMMENDATIONS:   Hypogonadotropic hypogonadism:   -Patient on clomiphene, 1 tablet 4 days a week -Patient has been noted with low testosterone in the past and inappropriately normal LH with low prolactin -I have recommended proceeding with pituitary MRI -Patient will return for fasting repeat testosterone, LH, and prolactin  Medications   Continue clomiphene 50 mg 1 tablet, 4 days a week  Follow-up in 6 months     Signed electronically by: Lyndle Herrlich, MD  [facility]  [facility] [address] [place] [place], [place] [address] Phone: [phone] FAX: [phone]      CC: Shade Flood, MD [address] [place] [address] Phone: [phone]  Fax: [phone]   Return to Endocrinology clinic as below: No future appointments.

## 2022-09-16 ENCOUNTER — Other Ambulatory Visit (INDEPENDENT_AMBULATORY_CARE_PROVIDER_SITE_OTHER): Payer: 59

## 2022-09-16 DIAGNOSIS — E230 Hypopituitarism: Secondary | ICD-10-CM

## 2022-09-16 LAB — FOLLICLE STIMULATING HORMONE: FSH: 7.6 m[IU]/mL (ref 1.4–18.1)

## 2022-09-16 LAB — LUTEINIZING HORMONE: LH: 3.96 m[IU]/mL (ref 1.50–9.30)

## 2022-09-20 LAB — PROLACTIN: Prolactin: 6 ng/mL (ref 2.0–18.0)

## 2022-09-20 LAB — TESTOSTERONE, TOTAL, LC/MS/MS: Testosterone, Total, LC-MS-MS: 463 ng/dL (ref 250–1100)

## 2022-09-27 ENCOUNTER — Other Ambulatory Visit: Payer: Self-pay | Admitting: Internal Medicine

## 2022-09-27 MED ORDER — CLOMIPHENE CITRATE 50 MG PO TABS: 50.0000 mg | ORAL_TABLET | ORAL | 3 refills | Status: DC

## 2022-10-07 ENCOUNTER — Ambulatory Visit
Admission: RE | Admit: 2022-10-07 | Discharge: 2022-10-07 | Disposition: A | Discharge status: Home / Self Care | Payer: 59 | Source: Ambulatory Visit | Attending: Internal Medicine | Admitting: Internal Medicine

## 2022-10-07 DIAGNOSIS — E230 Hypopituitarism: Secondary | ICD-10-CM

## 2022-10-12 ENCOUNTER — Ambulatory Visit: Payer: 59 | Admitting: Internal Medicine

## 2022-10-13 MED ORDER — GADOPICLENOL 0.5 MMOL/ML IV SOLN
10.0000 mL | Freq: Once | INTRAVENOUS | Status: AC | PRN
  Administered 2022-10-13: 10 mL via INTRAVENOUS

## 2023-01-31 ENCOUNTER — Other Ambulatory Visit: Payer: Self-pay | Admitting: Internal Medicine

## 2023-03-17 ENCOUNTER — Encounter: Payer: Self-pay | Admitting: Internal Medicine

## 2023-03-17 ENCOUNTER — Ambulatory Visit: Payer: 59 | Admitting: Internal Medicine

## 2023-03-17 VITALS — BP 126/78 | HR 92 | Ht 74.0 in | Wt 282.0 lb

## 2023-03-17 DIAGNOSIS — E230 Hypopituitarism: Secondary | ICD-10-CM

## 2023-03-17 MED ORDER — CLOMIPHENE CITRATE 50 MG PO TABS: 50.0000 mg | ORAL_TABLET | ORAL | 3 refills | Status: AC

## 2023-03-17 NOTE — Progress Notes (Unsigned)
Name: Stephen Compton  MRN/ DOB: [id number], [birth year]    Age/ Sex: 43 y.o., male     PCP: Stephen Flood, MD   Reason for Endocrinology Evaluation: Hypogonadism     Initial Endocrinology Clinic Visit: 09/2020    PATIENT IDENTIFIER: Stephen Compton is a 43 y.o., male with a past medical history of hypogonadism, asthma, bipolar disorder. He has followed with [facility] since 09/2020 for consultative assistance with management of his hypogonadism.   HISTORICAL SUMMARY: The patient was first diagnosed hypogonadism in 2014 with a testosterone level of 233, SHBG borderline low at 19 (13-71).  At the time he was seen by Dr. Elvera Lennox and due to normal free testosterone, no treatment was offered and the risk was deemed to outweigh the benefit.  He was again evaluated for low testosterone by Dr. Everardo All in 2022  His FSH was normal as well as LH, prolactin was undetectable <1.0 NG/mL with a testosterone level of 158 NG/DL in 0/8657   He was prescribed clomiphene by Dr. Everardo All 09/2020     Pt reports he had puberty at the normal age.  He has 1 biological child.  He has never had pituitary imaging.  He does not take antiandrogens or opioids.  He denies any h/o infertility, XRT, or genital infection.  He has never had surgery, or a serious injury to the head or genital area.  He has no h/o sleep apnea or DVT.   He does not consume alcohol excessively.    Labs in March 2024 revealed a testosterone level of 179 NG/mL, low prolactin level at 1.2, inappropriately normal LH of 2.18, normal TFTs.  Restarted clomiphene    SUBJECTIVE:    Today (03/17/2023):  Stephen Compton is here for hypogonadism.  Weight has been fluctuating  Denies headaches  He denies erectile dysfunction  Continues spontaneous erections  No galactorrhea  Denies visual changes    Clomiphene 1 tabs 4 times a week  Monday through Thursdays      HISTORY:  Past Medical History:  Past Medical History:   Diagnosis Date   Anxiety and depression    Asthma    Past Surgical History:  Past Surgical History:  Procedure Laterality Date   ROTATOR CUFF REPAIR Right 2017   TRIGGER FINGER RELEASE Right 08/16/2018   Procedure: RELEASE TRIGGER FINGER/A-1 PULLEY RIGHT MIDDLE FINGER;  Surgeon: Cindee Salt, MD;  Location: [facility];  Service: Orthopedics;  Laterality: Right;  FAB   WISDOM TOOTH EXTRACTION     Social History:  reports that he has quit smoking. His smokeless tobacco use includes snuff. He reports current alcohol use of about 1.0 standard drink of alcohol per week. He reports that he does not use drugs. Family History:  Family History  Problem Relation Age of Onset   Thyroid disease Mother    Depression Father    Alcohol abuse Father    Hypertension Brother    Breast cancer Maternal Grandmother    Cancer Maternal Grandfather        ? primary   Cancer Paternal Grandmother        ? primary   Alcohol abuse Paternal Grandmother    Cancer Paternal Grandfather        ? primary   Alcohol abuse Paternal Grandfather    Depression Sister    Depression Paternal Aunt        two   Alcohol abuse Paternal Aunt    Depression Paternal Uncle  Bipolar disorder Other        no FH but most likely present(see above)   Heart disease Neg Hx    Stroke Neg Hx      HOME MEDICATIONS: Allergies as of 03/17/2023       Reactions   Bupropion Other (See Comments)   Experienced suicidal ideation'; complete resolution off medication Makes depression worse   Latuda [lurasidone Hcl] Other (See Comments)   Suicidal thoughts        Medication List        Accurate as of March 17, 2023  2:52 PM. If you have any questions, ask your nurse or doctor.          ARIPiprazole 5 MG tablet Commonly known as: ABILIFY Take 5 mg by mouth daily.   Clomid 50 MG tablet Generic drug: clomiPHENE TAKE 1 TABLET (50 MG TOTAL) BY MOUTH AS DIRECTED. TAKE 1 TABLET FOUR DAYS A WEEK    lamoTRIgine 200 MG tablet Commonly known as: LAMICTAL Take 200 mg by mouth daily.   PARoxetine 30 MG tablet Commonly known as: PAXIL Take 30 mg by mouth daily.   propranolol 10 MG tablet Commonly known as: INDERAL Take 10 mg by mouth 3 (three) times daily.          OBJECTIVE:   PHYSICAL EXAM: VS: BP 126/78 (BP Location: Left Arm, Patient Position: Sitting, Cuff Size: Large)   Pulse 92   Ht 6\' 2"  (1.88 m)   Wt 282 lb (127.9 kg)   SpO2 95%   BMI 36.21 kg/m    EXAM: General: Pt appears well and is in NAD  Eyes: External eye exam normal without stare, lid lag or exophthalmos.   Neck: General: Supple without adenopathy. Thyroid: Thyroid size normal.    Lungs: Clear with good BS bilat   Heart: Auscultation: RRR.  Abdomen:  soft, nontender  Extremities:  BL LE: No pretibial edema normal   Mental Status: Judgment, insight: Intact Orientation: Oriented to time, place, and person Mood and affect: No depression, anxiety, or agitation     DATA REVIEWED:   Latest Reference Range & Units 04/06/22 08:00  Sodium 135 - 145 mEq/L 138  Potassium 3.5 - 5.1 mEq/L 4.3  Chloride 96 - 112 mEq/L 100  CO2 19 - 32 mEq/L 28  Glucose 70 - 99 mg/dL 90  BUN 6 - 23 mg/dL 21  Creatinine 6.04 - 5.40 mg/dL 9.81  Calcium 8.4 - 19.1 mg/dL 47.8  Alkaline Phosphatase 39 - 117 U/L 54  Albumin 3.5 - 5.2 g/dL 4.5  AST 0 - 37 U/L 19  ALT 0 - 53 U/L 28  Total Protein 6.0 - 8.3 g/dL 7.3  Total Bilirubin 0.2 - 1.2 mg/dL 0.7  GFR >29.56 mL/min 79.05    Latest Reference Range & Units 04/06/22 08:00  WBC 4.0 - 10.5 K/uL 6.5  RBC 4.22 - 5.81 Mil/uL 5.44  Hemoglobin 13.0 - 17.0 g/dL 21.3  HCT 08.6 - 57.8 % 47.6  MCV 78.0 - 100.0 fl 87.6  MCHC 30.0 - 36.0 g/dL 46.9  RDW 62.9 - 52.8 % 13.0  Platelets 150.0 - 400.0 K/uL 291.0    Latest Reference Range & Units 04/06/22 08:00  LH 1.50 - 9.30 mIU/mL 2.18  Prolactin 2.0 - 18.0 ng/mL 1.2 (L)  Glucose 70 - 99 mg/dL 90  Testosterone, Total,  LC-MS-MS 250 - 1,100 ng/dL 413 (L)  TSH 2.44 - 0.10 uIU/mL 3.07  T4,Free(Direct) 0.60 - 1.60 ng/dL 2.72  (L): Data is  abnormally low Old records , labs and images have been reviewed.   ASSESSMENT / PLAN / RECOMMENDATIONS:   Hypogonadotropic hypogonadism:   -Patient on clomiphene, 1 tablet 4 days a week -Patient has been noted with low testosterone in the past and inappropriately normal LH with low prolactin -Pituitary MRI was unrevealing 10/2022 -Patient will return for fasting repeat testosterone, LH, and prolactin  Medications   Continue clomiphene 50 mg 1 tablet, 4 days a week  Follow-up in 1 yr      Signed electronically by: Lyndle Herrlich, MD  [facility] Endocrinology  [facility] [address] [place] [place], [place] [address] Phone: [phone] FAX: [phone]      CC: Stephen Flood, MD [address] [place] [address] Phone: [phone]  Fax: [phone]   Return to Endocrinology clinic as below: No future appointments.

## 2023-03-18 ENCOUNTER — Other Ambulatory Visit: Payer: 59

## 2023-03-22 LAB — CBC
HCT: 45.2 % (ref 38.5–50.0)
Hemoglobin: 15.4 g/dL (ref 13.2–17.1)
MCH: 29.2 pg (ref 27.0–33.0)
MCHC: 34.1 g/dL (ref 32.0–36.0)
MCV: 85.8 fL (ref 80.0–100.0)
MPV: 10.1 fL (ref 7.5–12.5)
Platelets: 278 10*3/uL (ref 140–400)
RBC: 5.27 10*6/uL (ref 4.20–5.80)
RDW: 12.4 % (ref 11.0–15.0)
WBC: 6.3 10*3/uL (ref 3.8–10.8)

## 2023-03-22 LAB — COMPREHENSIVE METABOLIC PANEL
AG Ratio: 1.9 (calc) (ref 1.0–2.5)
ALT: 17 U/L (ref 9–46)
AST: 15 U/L (ref 10–40)
Albumin: 4.7 g/dL (ref 3.6–5.1)
Alkaline phosphatase (APISO): 38 U/L (ref 36–130)
BUN: 13 mg/dL (ref 7–25)
CO2: 30 mmol/L (ref 20–32)
Calcium: 9.5 mg/dL (ref 8.6–10.3)
Chloride: 102 mmol/L (ref 98–110)
Creat: 1.12 mg/dL (ref 0.60–1.29)
Globulin: 2.5 g/dL (ref 1.9–3.7)
Glucose, Bld: 103 mg/dL — ABNORMAL HIGH (ref 65–99)
Potassium: 4.7 mmol/L (ref 3.5–5.3)
Sodium: 138 mmol/L (ref 135–146)
Total Bilirubin: 0.5 mg/dL (ref 0.2–1.2)
Total Protein: 7.2 g/dL (ref 6.1–8.1)

## 2023-03-22 LAB — PROLACTIN: Prolactin: 4.9 ng/mL (ref 2.0–18.0)

## 2023-03-22 LAB — TESTOSTERONE, TOTAL, LC/MS/MS: Testosterone, Total, LC-MS-MS: 439 ng/dL (ref 250–1100)

## 2023-03-23 ENCOUNTER — Encounter: Payer: Self-pay | Admitting: Internal Medicine

## 2023-03-31 ENCOUNTER — Encounter: Payer: Self-pay | Admitting: Physician Assistant

## 2023-07-29 ENCOUNTER — Ambulatory Visit: Admitting: Family Medicine

## 2023-12-07 ENCOUNTER — Encounter: Payer: Self-pay | Admitting: Student in an Organized Health Care Education/Training Program

## 2023-12-07 ENCOUNTER — Ambulatory Visit: Payer: Self-pay

## 2023-12-07 ENCOUNTER — Ambulatory Visit: Admitting: Student in an Organized Health Care Education/Training Program

## 2023-12-07 VITALS — BP 132/90 | HR 92 | Wt 277.0 lb

## 2023-12-07 DIAGNOSIS — M545 Low back pain, unspecified: Secondary | ICD-10-CM | POA: Insufficient documentation

## 2023-12-07 MED ORDER — CYCLOBENZAPRINE HCL 5 MG PO TABS
5.0000 mg | ORAL_TABLET | Freq: Every evening | ORAL | 0 refills | Status: DC | PRN
Start: 2023-12-07 — End: 2023-12-16

## 2023-12-07 NOTE — Patient Instructions (Signed)
  VISIT SUMMARY: Today, you were seen for acute low back pain and recent diarrhea. You have been experiencing severe lower back pain for the past three weeks, which has been affecting your sleep and daily activities. You also had a brief episode of diarrhea yesterday, which has since resolved. We discussed your current medications and your history of mood issues, as well as your weight and its impact on your health.  YOUR PLAN: -ACUTE LOW BACK PAIN WITH LEFT-SIDED RADICULAR SYMPTOMS: This means you have pain in your lower back that radiates to your knee, likely due to a problem with a disc, muscle strain, or spinal column issue. We expect improvement within six weeks. You have been prescribed a mild muscle relaxer to help you sleep. If the pain persists beyond six weeks, we will consider physical therapy, an MRI, or a surgical consultation.  -OBESITY: Your BMI is 35, which is considered obese and can contribute to back problems. Managing your weight is important to reduce the risk of chronic back issues. We recommend discussing weight management strategies with Doctor Landy.  INSTRUCTIONS: Please follow up in six weeks if your back pain does not improve. If the pain persists, we may need to consider physical therapy, an MRI, or a surgical consultation. Additionally, discuss weight management with Doctor Landy to help reduce the risk of chronic back issues.

## 2023-12-07 NOTE — Telephone Encounter (Signed)
 Pts wife Charmaine calling back to see if pt can be scheduled sooner than tomorrow. Denies change in symptoms since speaking with triage earlier today. Appt rescheduled for today at [facility] with different provider d/t no PCP availability until tomorrow.

## 2023-12-07 NOTE — Telephone Encounter (Signed)
 FYI Only or Action Required?: FYI only for provider: appointment scheduled on 11.5.25.  Patient was last seen in primary care on 05/11/2022 by Levora Reyes SAUNDERS, MD.  Called Nurse Triage reporting Diarrhea.  Symptoms began yesterday.  Interventions attempted: Rest, hydration, or home remedies.  Symptoms are: unchanged.  Triage Disposition: See Physician Within 24 Hours  Patient/caregiver understands and will follow disposition?: Yes     Copied from CRM #[id number]. Topic: Clinical - Red Word Triage >> Dec 07, 2023 11:35 AM Alfonso ORN wrote: Red Word that prompted transfer to Nurse Triage: pt wife calling on behalf of pt  alot of diarrhea whole body pain, hurts to move and throat hurts . Feels worse than yesterday Reason for Disposition  [1] SEVERE diarrhea (e.g., 7 or more times / day more than normal) AND [2] present > 24 hours (1 day)  Answer Assessment - Initial Assessment Questions 1. DIARRHEA SEVERITY: How bad is the diarrhea? How many more stools have you had in the past 24 hours than normal?      Wife states all day yesterday, last night and this am.  2. ONSET: When did the diarrhea begin?      yesterday 3. STOOL DESCRIPTION:  How loose or watery is the diarrhea? What is the stool color? Is there any blood or mucous in the stool?     unknown 4. VOMITING: Are you also vomiting? If Yes, ask: How many times in the past 24 hours?      no 5. ABDOMEN PAIN: Are you having any abdomen pain? If Yes, ask: What does it feel like? (e.g., crampy, dull, intermittent, constant)      Yes, body aches 6. ABDOMEN PAIN SEVERITY: If present, ask: How bad is the pain?  (e.g., Scale 1-10; mild, moderate, or severe)      7. ORAL INTAKE: If vomiting, Have you been able to drink liquids? How much liquids have you had in the past 24 hours?     Yes she states he is drinking lots of fluids 8. HYDRATION: Any signs of dehydration? (e.g., dry mouth [not just dry lips], too weak to  stand, dizziness, new weight loss) When did you last urinate?     Drinking plenty of fluids  11. OTHER SYMPTOMS: Do you have any other symptoms? (e.g., fever, blood in stool)       Body aches, sore throat  Protocols used: Diarrhea-A-AH

## 2023-12-07 NOTE — Assessment & Plan Note (Signed)
 Symptoms most consistent with acute low back pain over the last 3 weeks.  No high risk features, no red flags like trauma, fever, or incontinence.  Pain persists despite NSAIDs, affecting sleep and daily activities. Differential includes herniated disc, muscle strain, or spinal column problem.  He has some radiation of the discomfort down his left leg to the knee, unclear if this is a true radiculopathy.  We talked about the natural course of acute low back pain.  Improvement expected within six weeks; further evaluation if persistent. Prescribed Flexeril  5 mg at night for sleep. Consider physical therapy if symptoms persist beyond six weeks. Discussed MRI and surgical consult if this becomes a chronic issue and no improvement after physical therapy.

## 2023-12-07 NOTE — Progress Notes (Signed)
 Acute Office Visit  Patient ID: Stephen Compton, male    DOB: Apr 12, 1980, 43 y.o.   MRN: [id number]  PCP: Levora Reyes SAUNDERS, MD  Chief Complaint  Patient presents with   Diarrhea    Per triage notes pt wife calling on behalf of pt  alot of diarrhea whole body pain, hurts to move and pain in back for 3 weeks .Feels worse than yesterday  No fevers,chills or sweating.     Subjective:     HPI  Discussed the use of AI scribe software for clinical note transcription with the patient, who gave verbal consent to proceed.  History of Present Illness Stephen Compton is a 43 year old male who presents with acute low back pain and recent diarrhea.  He has been experiencing severe lower back pain for the past three weeks, localized to the lower left side and occasionally radiating down to his knee, described as a 'numbing pain.' There is no radiation to the foot. He has not experienced this type of pain before and reports no history of falls, traumas, or car accidents. 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 The pain has been persistent, affecting his sleep as he wakes up frequently due to discomfort. Ibuprofen and Advil have been ineffective, and he describes the pain as deeper than muscle pain with associated stiffness in the back. No tingling or numbness in the skin, changes in urine control, fevers, chills, or groin pain. He reports weakness in the legs, likely due to the numbing pain in the knee.  He experienced diarrhea yesterday, which has resolved today. Everyone in his household was sick with diarrhea yesterday. No blood in the stools, fever, or chills associated with the diarrhea.  He has a history of mood issues and is currently taking paroxetine  and Lamictal. He was told he has bipolar or mood issues, but he does not know if he has bipolar. He also has a therapist, sports, Doctor Landy. He has not taken Clomivir in a long time. His mood tends to change  with the seasons, particularly during the winter months when daylight is reduced. He anticipates mood changes due to the time of year.  He reports frequent urination, which he attributes to high water intake, and denies any issues with urinary control. He has no personal history of cancer, although it runs in his family. He has a BMI of 35 and acknowledges the need to lose weight, noting that he has been at this weight before when he was in shape. He has a history of shoulder surgeries and a torn bicep tendon, which required physical therapy. He used to weightlift but has stopped due to pain.      Objective:    BP (!) 132/90   Pulse 92   Wt 277 lb (125.6 kg)   SpO2 99%   BMI 35.56 kg/m   Physical Exam  Gen: Well-appearing man Neuro: Alert, conversational, full strength upper and lower extremities, normal get up and go, normal patellar reflexes bilaterally, normal sensation throughout. Heart: Regular, no murmur Lungs: Unlabored, clear Psych: Appropriate mood and affect, not anxious or depressed.      Assessment & Plan:   Problem List Items Addressed This Visit       Unprioritized   Acute low back pain - Primary   Symptoms most consistent with acute low back pain over the last 3 weeks.  No high risk features, no red flags like trauma, fever, or incontinence.  Pain  persists despite NSAIDs, affecting sleep and daily activities. Differential includes herniated disc, muscle strain, or spinal column problem.  He has some radiation of the discomfort down his left leg to the knee, unclear if this is a true radiculopathy.  We talked about the natural course of acute low back pain.  Improvement expected within six weeks; further evaluation if persistent. Prescribed Flexeril  5 mg at night for sleep. Consider physical therapy if symptoms persist beyond six weeks. Discussed MRI and surgical consult if this becomes a chronic issue and no improvement after physical therapy.      Relevant  Medications   cyclobenzaprine  (FLEXERIL ) 5 MG tablet    Meds ordered this encounter  Medications   cyclobenzaprine  (FLEXERIL ) 5 MG tablet    Sig: Take 1 tablet (5 mg total) by mouth at bedtime as needed for muscle spasms.    Dispense:  20 tablet    Refill:  0    Return if symptoms worsen or fail to improve.  Cleatus Debby Specking, MD [facility] at [facility]

## 2023-12-08 ENCOUNTER — Ambulatory Visit: Admitting: Family Medicine

## 2023-12-15 ENCOUNTER — Encounter: Payer: Self-pay | Admitting: Student in an Organized Health Care Education/Training Program

## 2023-12-15 NOTE — Telephone Encounter (Signed)
 Patient was last seen by Dr.vincent on 11/4 and is wondering about completing a MRI mentioned at the visit?

## 2023-12-16 ENCOUNTER — Encounter: Payer: Self-pay | Admitting: Family Medicine

## 2023-12-16 ENCOUNTER — Ambulatory Visit: Admitting: Family Medicine

## 2023-12-16 VITALS — BP 130/78 | HR 72 | Temp 98.0°F | Resp 16 | Ht 74.0 in | Wt 275.8 lb

## 2023-12-16 DIAGNOSIS — M545 Low back pain, unspecified: Secondary | ICD-10-CM

## 2023-12-16 DIAGNOSIS — M25552 Pain in left hip: Secondary | ICD-10-CM

## 2023-12-16 MED ORDER — PREDNISONE 20 MG PO TABS
ORAL_TABLET | ORAL | 0 refills | Status: AC
Start: 2023-12-16 — End: ?

## 2023-12-16 MED ORDER — CYCLOBENZAPRINE HCL 5 MG PO TABS
5.0000 mg | ORAL_TABLET | Freq: Every evening | ORAL | 0 refills | Status: AC | PRN
Start: 2023-12-16 — End: ?

## 2023-12-16 NOTE — Patient Instructions (Addendum)
 Please have x-rays at the [facility] location below.  I suspect those will be okay but we will let you know if any concerns. We can try prednisone which is a stronger anti-inflammatory than Advil over-the-counter.  As we discussed there are some potential side effects or risks with that medication.  If you notice a significant change in mood symptoms, stop that medicine and let me know.  Okay to still use the muscle relaxant Flexeril  at bedtime and I have refilled that medication.  Do not combine prednisone with ibuprofen or Aleve.  Tylenol  is fine to use during the day if needed.  I will place a referral to physical therapy, and have requested weekend appointments or evening appointments if possible.  Follow-up with me in 1 month but give me an update in the next few weeks on how things are going.  Be seen sooner if worse.  Hang in there!  [facility] or xray: Walk in 8:30-4:30 during weekdays, no appointment needed [address]  [place], [place] [id number]   Acute Back Pain, Adult Acute back pain is sudden and usually short-lived. It is often caused by an injury to the muscles and tissues in the back. The injury may result from: A muscle, tendon, or ligament getting overstretched or torn. Ligaments are tissues that connect bones to each other. Lifting something improperly can cause a back strain. Wear and tear (degeneration) of the spinal disks. Spinal disks are circular tissue that provide cushioning between the bones of the spine (vertebrae). Twisting motions, such as while playing sports or doing yard work. A hit to the back. Arthritis. You may have a physical exam, lab tests, and imaging tests to find the cause of your pain. Acute back pain usually goes away with rest and home care. Follow these instructions at home: Managing pain, stiffness, and swelling Take over-the-counter and prescription medicines only as told by your health care provider. Treatment may include medicines for  pain and inflammation that are taken by mouth or applied to the skin, or muscle relaxants. Your health care provider may recommend applying ice during the first 24-48 hours after your pain starts. To do this: Put ice in a plastic bag. Place a towel between your skin and the bag. Leave the ice on for 20 minutes, 2-3 times a day. Remove the ice if your skin turns bright red. This is very important. If you cannot feel pain, heat, or cold, you have a greater risk of damage to the area. If directed, apply heat to the affected area as often as told by your health care provider. Use the heat source that your health care provider recommends, such as a moist heat pack or a heating pad. Place a towel between your skin and the heat source. Leave the heat on for 20-30 minutes. Remove the heat if your skin turns bright red. This is especially important if you are unable to feel pain, heat, or cold. You have a greater risk of getting burned. Activity  Do not stay in bed. Staying in bed for more than 1-2 days can delay your recovery. Sit up and stand up straight. Avoid leaning forward when you sit or hunching over when you stand. If you work at a desk, sit close to it so you do not need to lean over. Keep your chin tucked in. Keep your neck drawn back, and keep your elbows bent at a 90-degree angle (right angle). Sit high and close to the steering wheel when  you drive. Add lower back (lumbar) support to your car seat, if needed. Take short walks on even surfaces as soon as you are able. Try to increase the length of time you walk each day. Do not sit, drive, or stand in one place for more than 30 minutes at a time. Sitting or standing for long periods of time can put stress on your back. Do not drive or use heavy machinery while taking prescription pain medicine. Use proper lifting techniques. When you bend and lift, use positions that put less stress on your back: Nelson your knees. Keep the load close to your  body. Avoid twisting. Exercise regularly as told by your health care provider. Exercising helps your back heal faster and helps prevent back injuries by keeping muscles strong and flexible. Work with a physical therapist to make a safe exercise program, as recommended by your health care provider. Do any exercises as told by your physical therapist. Lifestyle Maintain a healthy weight. Extra weight puts stress on your back and makes it difficult to have good posture. Avoid activities or situations that make you feel anxious or stressed. Stress and anxiety increase muscle tension and can make back pain worse. Learn ways to manage anxiety and stress, such as through exercise. General instructions Sleep on a firm mattress in a comfortable position. Try lying on your side with your knees slightly bent. If you lie on your back, put a pillow under your knees. Keep your head and neck in a straight line with your spine (neutral position) when using electronic equipment like smartphones or pads. To do this: Raise your smartphone or pad to look at it instead of bending your head or neck to look down. Put the smartphone or pad at the level of your face while looking at the screen. Follow your treatment plan as told by your health care provider. This may include: Cognitive or behavioral therapy. Acupuncture or massage therapy. Meditation or yoga. Contact a health care provider if: You have pain that is not relieved with rest or medicine. You have increasing pain going down into your legs or buttocks. Your pain does not improve after 2 weeks. You have pain at night. You lose weight without trying. You have a fever or chills. You develop nausea or vomiting. You develop abdominal pain. Get help right away if: You develop new bowel or bladder control problems. You have unusual weakness or numbness in your arms or legs. You feel faint. These symptoms may represent a serious problem that is an emergency.  Do not wait to see if the symptoms will go away. Get medical help right away. Call your local emergency services (911 in the [place] Do not drive yourself to the hospital. Summary Acute back pain is sudden and usually short-lived. Use proper lifting techniques. When you bend and lift, use positions that put less stress on your back. Take over-the-counter and prescription medicines only as told by your health care provider, and apply heat or ice as told. This information is not intended to replace advice given to you by your health care provider. Make sure you discuss any questions you have with your health care provider. Document Revised: 04/12/2020 Document Reviewed: 04/12/2020 Elsevier Patient Education  2024 [employer].

## 2023-12-16 NOTE — Progress Notes (Signed)
 Subjective:  Patient ID: Stephen Compton, male    DOB: [birth year]  Age: 43 y.o. MRN: [id number]  CC:  Chief Complaint  Patient presents with   Back Pain    Pt notes 4 weeks of continued back pain worsening past week     HPI Stephen Compton presents for   Low back pain: Chart reviewed.  Elevated by my colleague 9 days ago.  Has been experiencing low back pain for the previous 3 weeks, localized to the lower left side occasionally rating down to his knee.  No prior fall trauma or MVC.  Physically active with his work but no apparent heavy lifting or injury.  Persistent pain with frequent wakening due to discomfort.  Ibuprofen ineffective.  Diagnosed with acute low back pain from the previous 3 weeks, no high risk features and no red flags at that visit.  Intermittent radiating pain down his left leg and knee, differential included herniated disc, muscle strain or spinal column problem.  Continue monitoring recommended with improvement expected within 6 weeks, treated with Flexeril  5 mg at bedtime, consider PT if symptoms persist, MRI or surgical consult if this became a chronic issue or no improvement after PT.  Since last visit - flexeril  has helped - less nighttime wakening. Pain worsens after waking up and with movement. Pain after getting ready. When done with work, pain still in back, but progresses to left hip by end of day. Pain with hip movement or walking is uncomfortable. Some shooting pain into front of knee. Sore into testicles form back pain at end of day after work.  Tx: ibuprofen last week - 400mg , then tylenol  up to 1000mg  - min relief.  No fever, weight loss, night sweats.  No bowel or bladder incontinence, no saddle anesthesia, no lower extremity weakness.     History Patient Active Problem List   Diagnosis Date Noted   Acute low back pain 12/07/2023   Low testosterone  09/26/2020   Fatigue 09/20/2014   Bipolar disorder (HCC) 09/15/2012   Libido, decreased 09/15/2012    Anxiety associated with depression 02/20/2010   Snuff user 02/20/2010   Asthma 02/20/2010   Past Medical History:  Diagnosis Date   Anxiety and depression    Asthma    Past Surgical History:  Procedure Laterality Date   ROTATOR CUFF REPAIR Right 2017   TRIGGER FINGER RELEASE Right 08/16/2018   Procedure: RELEASE TRIGGER FINGER/A-1 PULLEY RIGHT MIDDLE FINGER;  Surgeon: Murrell Kuba, MD;  Location: [facility];  Service: Orthopedics;  Laterality: Right;  FAB   WISDOM TOOTH EXTRACTION     Allergies  Allergen Reactions   Bupropion  Other (See Comments)    Experienced suicidal ideation'; complete resolution off medication  Makes depression worse   Latuda [Lurasidone Hcl] Other (See Comments)    Suicidal thoughts   Prior to Admission medications   Medication Sig Start Date End Date Taking? Authorizing Provider  ARIPiprazole (ABILIFY) 5 MG tablet Take 5 mg by mouth daily.   Yes [provider]  clomiPHENE  (CLOMID ) 50 MG tablet Take 1 tablet (50 mg total) by mouth as directed. 1 tablet four days a week 03/17/23  Yes Shamleffer, Ibtehal Jaralla, MD  cyclobenzaprine  (FLEXERIL ) 5 MG tablet Take 1 tablet (5 mg total) by mouth at bedtime as needed for muscle spasms. 12/07/23  Yes Jerrell Cleatus Ned, MD  lamoTRIgine (LAMICTAL) 200 MG tablet Take 200 mg by mouth daily. 02/18/20  Yes [provider]  PARoxetine  (PAXIL ) 30 MG  tablet Take 30 mg by mouth daily.   Yes [provider]  propranolol (INDERAL) 10 MG tablet Take 10 mg by mouth 3 (three) times daily.   Yes [provider]   Social History   Socioeconomic History   Marital status: Married    Spouse name: Not on file   Number of children: Not on file   Years of education: Not on file   Highest education level: Not on file  Occupational History   Not on file  Tobacco Use   Smoking status: Former   Smokeless tobacco: Current    Types: Snuff   Tobacco comments:    1ppd ages [age]; 1  can tobacco every 3 days  Vaping Use   Vaping status: Former  Substance and Sexual Activity   Alcohol use: Yes    Alcohol/week: 1.0 standard drink of alcohol    Types: 1 Cans of beer per week    Comment:  intermittent  drinker   Drug use: No   Sexual activity: Never  Other Topics Concern   Not on file  Social History Narrative   Not on file   Social Drivers of Health   Financial Resource Strain: Not on file  Food Insecurity: Not on file  Transportation Needs: Not on file  Physical Activity: Not on file  Stress: Not on file  Social Connections: Unknown (06/14/2021)   Received [facility]   Social Network    Social Network: Not on file  Intimate Partner Violence: Unknown (05/07/2021)   Received from [facility]   HITS    Physically Hurt: Not on file    Insult or Talk Down To: Not on file    Threaten Physical Harm: Not on file    Scream or Curse: Not on file    Review of Systems Per HPI.   Objective:   Vitals:   12/16/23 1614  BP: 130/78  Pulse: 72  Resp: 16  Temp: 98 F (36.7 C)  TempSrc: Temporal  SpO2: 99%  Weight: 275 lb 12.8 oz (125.1 kg)  Height: 6' 2 (1.88 m)     Physical Exam Vitals reviewed.  Constitutional:      Appearance: He is well-developed.  HENT:     Head: Normocephalic and atraumatic.  Pulmonary:     Effort: Pulmonary effort is normal.  Abdominal:     Palpations: Abdomen is soft.     Tenderness: There is no abdominal tenderness.  Musculoskeletal:        General: Tenderness (Left lower paraspinals towards SI joint.  No midline bony tenderness.  Discomfort in left low back only with seated straight leg raise on left and right.) present.     Cervical back: Normal range of motion.     Lumbar back: Spasms and tenderness present. No bony tenderness. Normal range of motion.     Comments: Lumbar spine range of motion 80 degrees flexion, intact rotation, lateral flexion, able to heel and toe walk without difficulty or weakness,  ambulating without assistive device.  Discomfort into the left anterior hip with external rotation, not internal rotation.  Skin:    General: Skin is warm and dry.  Neurological:     Mental Status: He is alert.     Sensory: No sensory deficit.     Deep Tendon Reflexes:     Reflex Scores:      Patellar reflexes are 2+ on the right side and 2+ on the left side.      Achilles reflexes  are 2+ on the right side and 2+ on the left side.    Comments: Reflexes 2+ at Achilles bilaterally, 1+, more difficult at patella but equal bilaterally.  Psychiatric:        Behavior: Behavior normal.        Assessment & Plan:  Stephen Compton is a 43 y.o. male . Left hip pain - Plan: DG HIP UNILAT W OR W/O PELVIS 2-3 VIEWS LEFT, Ambulatory referral to Physical Therapy  Acute left-sided low back pain without sciatica - Plan: predniSONE (DELTASONE) 20 MG tablet, cyclobenzaprine  (FLEXERIL ) 5 MG tablet, DG Lumbar Spine Complete, Ambulatory referral to Physical Therapy  Persistent left low back pain, intermittent worsening as above, now with intermittent left hip pain.  Potentially due to change in gait with back pain.  No red flags on exam.  We discussed potential causes including possible disc herniation, bulge and change in treatment options.  Some benefit with Flexeril , will continue.  Check imaging based on timing of symptoms along with new left hip discomfort.  Plain x-ray to begin with at this time, consider MRI if not improving with prednisone, physical therapy as below, with RTC/ER precautions given if new/worsening symptoms.  Will start prednisone taper with potential side effects and risks discussed including potential psychiatric risk and he should stop that right away if any worsening mood symptoms.  Understanding expressed.  Hold NSAIDs while taking prednisone.  Handout given with RTC precautions given.  Recheck 1 month.  Meds ordered this encounter  Medications   predniSONE (DELTASONE) 20 MG tablet     Sig: 3 by mouth for 3 days, then 2 by mouth for 2 days, then 1 by mouth for 2 days, then 1/2 by mouth for 2 days.    Dispense:  16 tablet    Refill:  0   cyclobenzaprine  (FLEXERIL ) 5 MG tablet    Sig: Take 1 tablet (5 mg total) by mouth at bedtime as needed for muscle spasms.    Dispense:  20 tablet    Refill:  0   Patient Instructions  Please have x-rays at the [facility] location below.  I suspect those will be okay but we will let you know if any concerns. We can try prednisone which is a stronger anti-inflammatory than Advil over-the-counter.  As we discussed there are some potential side effects or risks with that medication.  If you notice a significant change in mood symptoms, stop that medicine and let me know.  Okay to still use the muscle relaxant Flexeril  at bedtime and I have refilled that medication.  Do not combine prednisone with ibuprofen or Aleve.  Tylenol  is fine to use during the day if needed.  I will place a referral to physical therapy, and have requested weekend appointments or evening appointments if possible.  Follow-up with me in 1 month but give me an update in the next few weeks on how things are going.  Be seen sooner if worse.  Hang in there!  [facility] or xray: Walk in 8:30-4:30 during weekdays, no appointment needed [address]  [place], [place] [id number]   Acute Back Pain, Adult Acute back pain is sudden and usually short-lived. It is often caused by an injury to the muscles and tissues in the back. The injury may result from: A muscle, tendon, or ligament getting overstretched or torn. Ligaments are tissues that connect bones to each other. Lifting something improperly can cause a back strain. Wear and tear (degeneration) of  the spinal disks. Spinal disks are circular tissue that provide cushioning between the bones of the spine (vertebrae). Twisting motions, such as while playing sports or doing yard work. A hit to the back. Arthritis. You  may have a physical exam, lab tests, and imaging tests to find the cause of your pain. Acute back pain usually goes away with rest and home care. Follow these instructions at home: Managing pain, stiffness, and swelling Take over-the-counter and prescription medicines only as told by your health care provider. Treatment may include medicines for pain and inflammation that are taken by mouth or applied to the skin, or muscle relaxants. Your health care provider may recommend applying ice during the first 24-48 hours after your pain starts. To do this: Put ice in a plastic bag. Place a towel between your skin and the bag. Leave the ice on for 20 minutes, 2-3 times a day. Remove the ice if your skin turns bright red. This is very important. If you cannot feel pain, heat, or cold, you have a greater risk of damage to the area. If directed, apply heat to the affected area as often as told by your health care provider. Use the heat source that your health care provider recommends, such as a moist heat pack or a heating pad. Place a towel between your skin and the heat source. Leave the heat on for 20-30 minutes. Remove the heat if your skin turns bright red. This is especially important if you are unable to feel pain, heat, or cold. You have a greater risk of getting burned. Activity  Do not stay in bed. Staying in bed for more than 1-2 days can delay your recovery. Sit up and stand up straight. Avoid leaning forward when you sit or hunching over when you stand. If you work at a desk, sit close to it so you do not need to lean over. Keep your chin tucked in. Keep your neck drawn back, and keep your elbows bent at a 90-degree angle (right angle). Sit high and close to the steering wheel when you drive. Add lower back (lumbar) support to your car seat, if needed. Take short walks on even surfaces as soon as you are able. Try to increase the length of time you walk each day. Do not sit, drive, or stand in  one place for more than 30 minutes at a time. Sitting or standing for long periods of time can put stress on your back. Do not drive or use heavy machinery while taking prescription pain medicine. Use proper lifting techniques. When you bend and lift, use positions that put less stress on your back: [place] your knees. Keep the load close to your body. Avoid twisting. Exercise regularly as told by your health care provider. Exercising helps your back heal faster and helps prevent back injuries by keeping muscles strong and flexible. Work with a physical therapist to make a safe exercise program, as recommended by your health care provider. Do any exercises as told by your physical therapist. Lifestyle Maintain a healthy weight. Extra weight puts stress on your back and makes it difficult to have good posture. Avoid activities or situations that make you feel anxious or stressed. Stress and anxiety increase muscle tension and can make back pain worse. Learn ways to manage anxiety and stress, such as through exercise. General instructions Sleep on a firm mattress in a comfortable position. Try lying on your side with your knees slightly bent. If you lie on your back,  put a pillow under your knees. Keep your head and neck in a straight line with your spine (neutral position) when using electronic equipment like smartphones or pads. To do this: Raise your smartphone or pad to look at it instead of bending your head or neck to look down. Put the smartphone or pad at the level of your face while looking at the screen. Follow your treatment plan as told by your health care provider. This may include: Cognitive or behavioral therapy. Acupuncture or massage therapy. Meditation or yoga. Contact a health care provider if: You have pain that is not relieved with rest or medicine. You have increasing pain going down into your legs or buttocks. Your pain does not improve after 2 weeks. You have pain at  night. You lose weight without trying. You have a fever or chills. You develop nausea or vomiting. You develop abdominal pain. Get help right away if: You develop new bowel or bladder control problems. You have unusual weakness or numbness in your arms or legs. You feel faint. These symptoms may represent a serious problem that is an emergency. Do not wait to see if the symptoms will go away. Get medical help right away. Call your local emergency services (911 in the [place] Do not drive yourself to the hospital. Summary Acute back pain is sudden and usually short-lived. Use proper lifting techniques. When you bend and lift, use positions that put less stress on your back. Take over-the-counter and prescription medicines only as told by your health care provider, and apply heat or ice as told. This information is not intended to replace advice given to you by your health care provider. Make sure you discuss any questions you have with your health care provider. Document Revised: 04/12/2020 Document Reviewed: 04/12/2020 Elsevier Patient Education  2024 [employer]    Signed,   Reyes Pines, MD [facility], [facility] 12/16/23 4:55 PM

## 2023-12-21 ENCOUNTER — Telehealth: Payer: Self-pay | Admitting: Family Medicine

## 2023-12-21 NOTE — Telephone Encounter (Signed)
 Type of form received: Referral missing Info  Additional comments:   Received by: Fax  Form should be Faxed/mailed to: (address/ fax #) [phone]  Is patient requesting call for pickup: N/A  Form placed:  Labeled & placed in provider bin  Attach charge sheet.  Provider will determine charge.  Individual made aware of 3-5 business day turn around? N/A

## 2023-12-22 NOTE — Telephone Encounter (Signed)
 Placed in sign folder at [facility]

## 2023-12-23 NOTE — Telephone Encounter (Signed)
 Please see details on highlighted last page of paperwork. This is a request for the actual referral.  I referred patient to physical therapy and referral also suggests that he was referred to physical medicine?  Not sure if this was sent to correct department. Please assist with this form. I will also route this message to Tobias to make sure referral sent to PT. Thanks.

## 2023-12-23 NOTE — Telephone Encounter (Signed)
 I called [facility] to clarify exactly what they needed and was told they just needed the provider to sign the referral and fax it back. I printed out the referral from the patient's chart, LOV, and demographics. I had Dr. Levora sign the referral and I faxed all information back to [facility] at [phone] (confirmed fax with [facility]).

## 2024-03-29 ENCOUNTER — Ambulatory Visit: Payer: 59 | Admitting: Internal Medicine

## 2024-04-12 ENCOUNTER — Ambulatory Visit: Admitting: Internal Medicine
